# Patient Record
Sex: Male | Born: 1950 | Race: White | Hispanic: No | Marital: Married | Smoking: Never smoker
Health system: Southern US, Community
[De-identification: ages and names within clinical notes are randomized; demographics above are authoritative.]

## PROBLEM LIST (undated history)

## (undated) DIAGNOSIS — E039 Hypothyroidism, unspecified: Secondary | ICD-10-CM

## (undated) DIAGNOSIS — E0590 Thyrotoxicosis, unspecified without thyrotoxic crisis or storm: Secondary | ICD-10-CM

## (undated) DIAGNOSIS — E785 Hyperlipidemia, unspecified: Secondary | ICD-10-CM

## (undated) DIAGNOSIS — K219 Gastro-esophageal reflux disease without esophagitis: Secondary | ICD-10-CM

## (undated) DIAGNOSIS — M1611 Unilateral primary osteoarthritis, right hip: Secondary | ICD-10-CM

## (undated) HISTORY — DX: Hyperlipidemia, unspecified: E78.5

## (undated) HISTORY — PX: COLONOSCOPY: SHX174

## (undated) HISTORY — DX: Gastro-esophageal reflux disease without esophagitis: K21.9

## (undated) HISTORY — PX: ESOPHAGOGASTRODUODENOSCOPY: SHX1529

## (undated) HISTORY — DX: Hypothyroidism, unspecified: E03.9

---

## 2000-08-03 ENCOUNTER — Ambulatory Visit (HOSPITAL_COMMUNITY): Admission: RE | Admit: 2000-08-03 | Discharge: 2000-08-03 | Payer: Self-pay | Admitting: *Deleted

## 2001-11-17 ENCOUNTER — Emergency Department (HOSPITAL_COMMUNITY): Admission: EM | Admit: 2001-11-17 | Discharge: 2001-11-18 | Payer: Self-pay | Admitting: Emergency Medicine

## 2010-09-01 ENCOUNTER — Ambulatory Visit: Payer: 59 | Attending: Family Medicine | Admitting: Physical Therapy

## 2010-09-01 DIAGNOSIS — IMO0001 Reserved for inherently not codable concepts without codable children: Secondary | ICD-10-CM | POA: Insufficient documentation

## 2010-09-01 DIAGNOSIS — M545 Low back pain, unspecified: Secondary | ICD-10-CM | POA: Insufficient documentation

## 2010-09-01 DIAGNOSIS — M2569 Stiffness of other specified joint, not elsewhere classified: Secondary | ICD-10-CM | POA: Insufficient documentation

## 2010-09-02 ENCOUNTER — Ambulatory Visit: Payer: 59 | Admitting: Physical Therapy

## 2010-09-05 ENCOUNTER — Ambulatory Visit: Payer: 59 | Admitting: Physical Therapy

## 2010-09-07 ENCOUNTER — Encounter: Payer: 59 | Admitting: Physical Therapy

## 2010-09-12 ENCOUNTER — Encounter: Payer: 59 | Admitting: Physical Therapy

## 2010-09-14 ENCOUNTER — Encounter: Payer: 59 | Admitting: Physical Therapy

## 2010-09-19 ENCOUNTER — Encounter: Payer: 59 | Admitting: Physical Therapy

## 2010-09-21 ENCOUNTER — Encounter: Payer: 59 | Admitting: Physical Therapy

## 2015-08-02 ENCOUNTER — Encounter: Payer: Self-pay | Admitting: Physician Assistant

## 2015-08-03 ENCOUNTER — Ambulatory Visit (INDEPENDENT_AMBULATORY_CARE_PROVIDER_SITE_OTHER): Payer: 59 | Admitting: Physician Assistant

## 2015-08-03 ENCOUNTER — Encounter: Payer: Self-pay | Admitting: Physician Assistant

## 2015-08-03 VITALS — BP 122/74 | HR 68 | Ht 70.0 in | Wt 190.4 lb

## 2015-08-03 DIAGNOSIS — R0789 Other chest pain: Secondary | ICD-10-CM | POA: Diagnosis not present

## 2015-08-03 DIAGNOSIS — E785 Hyperlipidemia, unspecified: Secondary | ICD-10-CM

## 2015-08-03 DIAGNOSIS — K219 Gastro-esophageal reflux disease without esophagitis: Secondary | ICD-10-CM | POA: Diagnosis not present

## 2015-08-03 NOTE — Patient Instructions (Signed)
Medication Instructions:  No changes.  See your medication list.   Labwork: None   Testing/Procedures: Schedule an exercise treadmill test.  Follow-Up: Dr. Liam Rogers as needed.   Any Other Special Instructions Will Be Listed Below (If Applicable). Your physician has requested that you have an exercise tolerance test. For further information please visit [email or web address]. Please also follow instruction sheet, as given.  If you need a refill on your cardiac medications before your next appointment, please call your pharmacy.

## 2015-08-03 NOTE — Progress Notes (Signed)
Cardiology Office Note:    Date:  08/03/2015   ID:  Terry Chapman, DOB July 25, 1950, MRN [id number]  PCP:   Melinda Crutch, MD  Cardiologist:  New - Dr. Liam Rogers    Referring MD:  Dr. Lawernce Pitts   Chief Complaint  Patient presents with  . Chest Pain    New Consult    History of Present Illness:     Terry Chapman is a 65 y.o. male with a hx of GERD, HL.  He is referred by Dr. Rex Kras for the evaluation of chest pain.  He has noticed chest pain off and on for years. But, 2 weeks ago, he had a severe episode.  This only went on for seconds.  He is friends with Dr. Rex Kras and he recommended evaluation with Cardiology.  His chest discomfort is located in the epigastrium and lower sternum.  He denies associated dyspnea or diaphoresis.  He did feel somewhat nauseated.  He denies any hx of syncope, orthopnea, PND, edema.  He exercises frequently.  He plays tennis 3 days a week and walks on a treadmill.  He denies any exertional chest pain or dyspnea.      Past Medical History  Diagnosis Date  . GERD (gastroesophageal reflux disease)   . Hyperlipidemia     Past Surgical History  Procedure Laterality Date  . Colonoscopy    . Esophagogastroduodenoscopy      Current Medications: Outpatient Prescriptions Prior to Visit  Medication Sig Dispense Refill  . esomeprazole (NEXIUM) 20 MG capsule Take 20 mg by mouth daily at 12 noon.    . rosuvastatin (CRESTOR) 5 MG tablet Take 5 mg by mouth daily.    . sucralfate (CARAFATE) 1 GM/10ML suspension Take 1 g by mouth 2 (two) times daily. Reported on 08/03/2015     No facility-administered medications prior to visit.     Allergies:   Review of patient's allergies indicates no known allergies.   Social History   Social History  . Marital Status: Married    Spouse Name: N/A  . Number of Children: 2  . Years of Education: N/A   Occupational History  . civil Chief Financial Officer    Social History Main Topics  . Smoking status: Never Smoker   . Smokeless  tobacco: None  . Alcohol Use: No  . Drug Use: No  . Sexual Activity: Not Asked   Other Topics Concern  . None   Social History Narrative   Civil engineer, contracting - [employer]   [employer]   Married   stepchildren     Family History:  The patient's family history includes Alcohol abuse in his father; Colon cancer in his father; Hyperlipidemia in his sister and sister; Hypertension in his father. There is no history of Heart attack.   ROS:   Please see the history of present illness.    Review of Systems  Constitution: Negative for weight loss.  Cardiovascular: Positive for irregular heartbeat.  Gastrointestinal: Negative for dysphagia, hematochezia and vomiting.  Psychiatric/Behavioral: The patient is nervous/anxious.   All other systems reviewed and are negative.   Physical Exam:    VS:  BP 122/74 mmHg  Pulse 68  Ht 5\' 10"  (1.778 m)  Wt 190 lb 6.4 oz (86.365 kg)  BMI 27.32 kg/m2   GEN: Well nourished, well developed, in no acute distress HEENT: normal Neck: no JVD, no masses Cardiac: Normal S1/S2, RRR; no murmurs, rubs, or gallops, no edema;  no carotid bruits,   Respiratory:  clear  to auscultation bilaterally; no wheezing, rhonchi or rales GI: soft, nontender, nondistended, + BS MS: no deformity or atrophy Skin: warm and dry, no rash Neuro:  Bilateral strength equal, no focal deficits  Psych: Alert and oriented x 3, normal affect  Wt Readings from Last 3 Encounters:  08/03/15 190 lb 6.4 oz (86.365 kg)      Studies/Labs Reviewed:     EKG:  EKG is  ordered today.  The ekg ordered today demonstrates NSR, HR 62, septal Q waves, QTc 395 ms  Recent Labs: No results found for requested labs within last 365 days.   Recent Lipid Panel No results found for: CHOL, TRIG, HDL, CHOLHDL, VLDL, LDLCALC, LDLDIRECT  Additional studies/ records that were reviewed today include:   None    ASSESSMENT:     1. Other chest pain   2. Gastroesophageal reflux disease, esophagitis  presence not specified   3. Hyperlipidemia     PLAN:     In order of problems listed above:  1. Chest pain - Atypical chest symptoms.  He has few risk factors to include age, gender and HL.  ECG is normal. We will arrange a plain Exercise Stress Test. If low risk, FU with PCP to evaluate other causes of chest pain.  2. GERD - Continue PPI. FU with PCP.   3. HL - He remains on Rosuvastatin.  This is managed by primary care. I will request most recent labs.      Medication Adjustments/Labs and Tests Ordered: Current medicines are reviewed at length with the patient today.  Concerns regarding medicines are outlined above.  Medication changes, Labs and Tests ordered today are outlined in the Patient Instructions noted below. Patient Instructions  Medication Instructions:  No changes.  See your medication list.   Labwork: None   Testing/Procedures: Schedule an exercise treadmill test.  Follow-Up: Dr. Liam Rogers as needed.   Any Other Special Instructions Will Be Listed Below (If Applicable). Your physician has requested that you have an exercise tolerance test. For further information please visit [email or web address]. Please also follow instruction sheet, as given.  If you need a refill on your cardiac medications before your next appointment, please call your pharmacy.     Signed, Richardson Dopp, PA-C  08/03/2015 3:23 PM    [facility] [place], [place], [place]  [address] Phone: [phone]; Fax: [phone]   Attending Note:   The patient was seen and examined.  Agree with assessment and plan as noted above.  Changes made to the above note as needed.  I have reviewed the chart with Richardson Dopp and have talked with and examined the patient . His CP is very atypical.   Will do a GXT. He can follow up with me as needed ( assumig the GXT is normal )    Thayer Headings, Brooke Bonito., MD, [facility] 08/03/2015, 6:49 PM [address],  [place] Pager [phone]

## 2015-08-11 ENCOUNTER — Ambulatory Visit (INDEPENDENT_AMBULATORY_CARE_PROVIDER_SITE_OTHER): Payer: 59

## 2015-08-11 DIAGNOSIS — R0789 Other chest pain: Secondary | ICD-10-CM | POA: Diagnosis not present

## 2015-08-11 LAB — EXERCISE TOLERANCE TEST
CHL CUP MPHR: 157 {beats}/min
CHL CUP RESTING HR STRESS: 59 {beats}/min
CHL CUP STRESS STAGE 1 DBP: 98 mmHg
CHL CUP STRESS STAGE 1 GRADE: 0 %
CHL CUP STRESS STAGE 1 HR: 65 {beats}/min
CHL CUP STRESS STAGE 3 SPEED: 1 mph
CHL CUP STRESS STAGE 4 DBP: 93 mmHg
CHL CUP STRESS STAGE 4 GRADE: 10 %
CHL CUP STRESS STAGE 4 HR: 83 {beats}/min
CHL CUP STRESS STAGE 4 SBP: 155 mmHg
CHL CUP STRESS STAGE 4 SPEED: 1.7 mph
CHL CUP STRESS STAGE 5 DBP: 93 mmHg
CHL CUP STRESS STAGE 5 SBP: 167 mmHg
CHL CUP STRESS STAGE 6 DBP: 92 mmHg
CHL CUP STRESS STAGE 6 GRADE: 14 %
CHL CUP STRESS STAGE 7 HR: 148 {beats}/min
CHL CUP STRESS STAGE 8 SBP: 184 mmHg
CHL CUP STRESS STAGE 8 SPEED: 1.5 mph
CHL CUP STRESS STAGE 9 HR: 76 {beats}/min
CHL CUP STRESS STAGE 9 SBP: 150 mmHg
CSEPED: 11 min
Estimated workload: 13.3 METS
Exercise duration (sec): 0 s
Peak HR: 148 {beats}/min
Percent HR: 94 %
Percent of predicted max HR: 94 %
RPE: 17
Stage 1 SBP: 146 mmHg
Stage 1 Speed: 0 mph
Stage 2 Grade: 0 %
Stage 2 HR: 74 {beats}/min
Stage 2 Speed: 1 mph
Stage 3 Grade: 0.2 %
Stage 3 HR: 75 {beats}/min
Stage 5 Grade: 12 %
Stage 5 HR: 99 {beats}/min
Stage 5 Speed: 2.5 mph
Stage 6 HR: 120 {beats}/min
Stage 6 SBP: 178 mmHg
Stage 6 Speed: 3.4 mph
Stage 7 Grade: 16 %
Stage 7 Speed: 4.2 mph
Stage 8 DBP: 94 mmHg
Stage 8 Grade: 0 %
Stage 8 HR: 123 {beats}/min
Stage 9 DBP: 94 mmHg
Stage 9 Grade: 0 %
Stage 9 Speed: 0 mph

## 2016-07-11 DIAGNOSIS — K449 Diaphragmatic hernia without obstruction or gangrene: Secondary | ICD-10-CM | POA: Diagnosis not present

## 2016-07-11 DIAGNOSIS — K219 Gastro-esophageal reflux disease without esophagitis: Secondary | ICD-10-CM | POA: Diagnosis not present

## 2016-07-14 DIAGNOSIS — H01024 Squamous blepharitis left upper eyelid: Secondary | ICD-10-CM | POA: Diagnosis not present

## 2016-07-14 DIAGNOSIS — H01021 Squamous blepharitis right upper eyelid: Secondary | ICD-10-CM | POA: Diagnosis not present

## 2016-07-14 DIAGNOSIS — H40013 Open angle with borderline findings, low risk, bilateral: Secondary | ICD-10-CM | POA: Diagnosis not present

## 2016-08-16 DIAGNOSIS — E559 Vitamin D deficiency, unspecified: Secondary | ICD-10-CM | POA: Diagnosis not present

## 2016-08-16 DIAGNOSIS — E785 Hyperlipidemia, unspecified: Secondary | ICD-10-CM | POA: Diagnosis not present

## 2016-08-16 DIAGNOSIS — Z0000 Encounter for general adult medical examination without abnormal findings: Secondary | ICD-10-CM | POA: Diagnosis not present

## 2016-08-21 DIAGNOSIS — Z0000 Encounter for general adult medical examination without abnormal findings: Secondary | ICD-10-CM | POA: Diagnosis not present

## 2016-11-10 DIAGNOSIS — H40013 Open angle with borderline findings, low risk, bilateral: Secondary | ICD-10-CM | POA: Diagnosis not present

## 2017-07-02 DIAGNOSIS — M25559 Pain in unspecified hip: Secondary | ICD-10-CM | POA: Diagnosis not present

## 2017-07-06 DIAGNOSIS — M25551 Pain in right hip: Secondary | ICD-10-CM | POA: Diagnosis not present

## 2017-07-13 DIAGNOSIS — H01024 Squamous blepharitis left upper eyelid: Secondary | ICD-10-CM | POA: Diagnosis not present

## 2017-07-13 DIAGNOSIS — H01021 Squamous blepharitis right upper eyelid: Secondary | ICD-10-CM | POA: Diagnosis not present

## 2017-07-13 DIAGNOSIS — H43811 Vitreous degeneration, right eye: Secondary | ICD-10-CM | POA: Diagnosis not present

## 2017-07-17 DIAGNOSIS — M25551 Pain in right hip: Secondary | ICD-10-CM | POA: Diagnosis not present

## 2017-07-17 DIAGNOSIS — M25651 Stiffness of right hip, not elsewhere classified: Secondary | ICD-10-CM | POA: Diagnosis not present

## 2017-07-17 DIAGNOSIS — R531 Weakness: Secondary | ICD-10-CM | POA: Diagnosis not present

## 2017-08-06 DIAGNOSIS — M25551 Pain in right hip: Secondary | ICD-10-CM | POA: Diagnosis not present

## 2017-08-09 DIAGNOSIS — M25551 Pain in right hip: Secondary | ICD-10-CM | POA: Diagnosis not present

## 2017-08-09 DIAGNOSIS — M25651 Stiffness of right hip, not elsewhere classified: Secondary | ICD-10-CM | POA: Diagnosis not present

## 2017-08-09 DIAGNOSIS — M1611 Unilateral primary osteoarthritis, right hip: Secondary | ICD-10-CM | POA: Diagnosis not present

## 2017-09-14 DIAGNOSIS — M25551 Pain in right hip: Secondary | ICD-10-CM | POA: Diagnosis not present

## 2017-09-14 DIAGNOSIS — M1611 Unilateral primary osteoarthritis, right hip: Secondary | ICD-10-CM | POA: Diagnosis not present

## 2017-09-18 DIAGNOSIS — E559 Vitamin D deficiency, unspecified: Secondary | ICD-10-CM | POA: Diagnosis not present

## 2017-09-18 DIAGNOSIS — R946 Abnormal results of thyroid function studies: Secondary | ICD-10-CM | POA: Diagnosis not present

## 2017-09-18 DIAGNOSIS — E785 Hyperlipidemia, unspecified: Secondary | ICD-10-CM | POA: Diagnosis not present

## 2017-09-18 DIAGNOSIS — Z125 Encounter for screening for malignant neoplasm of prostate: Secondary | ICD-10-CM | POA: Diagnosis not present

## 2017-09-19 DIAGNOSIS — M25511 Pain in right shoulder: Secondary | ICD-10-CM | POA: Diagnosis not present

## 2017-09-19 DIAGNOSIS — Z0000 Encounter for general adult medical examination without abnormal findings: Secondary | ICD-10-CM | POA: Diagnosis not present

## 2017-09-19 DIAGNOSIS — Z23 Encounter for immunization: Secondary | ICD-10-CM | POA: Diagnosis not present

## 2017-09-21 DIAGNOSIS — M1611 Unilateral primary osteoarthritis, right hip: Secondary | ICD-10-CM | POA: Diagnosis not present

## 2017-10-05 DIAGNOSIS — M25551 Pain in right hip: Secondary | ICD-10-CM | POA: Diagnosis not present

## 2017-10-05 DIAGNOSIS — M1611 Unilateral primary osteoarthritis, right hip: Secondary | ICD-10-CM | POA: Diagnosis not present

## 2017-10-29 DIAGNOSIS — M1611 Unilateral primary osteoarthritis, right hip: Secondary | ICD-10-CM | POA: Diagnosis not present

## 2017-12-07 ENCOUNTER — Other Ambulatory Visit: Payer: Self-pay | Admitting: Orthopedic Surgery

## 2017-12-13 ENCOUNTER — Encounter (HOSPITAL_COMMUNITY): Payer: Self-pay

## 2017-12-13 NOTE — Pre-Procedure Instructions (Signed)
Terry Chapman  12/13/2017      [id number] [place], [place] [address] [place] [address] Phone: [phone] Fax: [phone]    Your procedure is scheduled on July 2  Report to [facility] Admitting at 0800 A.M.  Call this number if you have problems the morning of surgery:  [phone]   Remember:  NOTHING TO EAT OR DRINK AFTER MIDNIGHT    Take these medicines the morning of surgery with A SIP OF WATER  esomeprazole (NEXIUM)  levothyroxine (SYNTHROID, LEVOTHROID)   7 days prior to surgery STOP taking any Aspirin(unless otherwise instructed by your surgeon), Aleve, Naproxen, Ibuprofen, Motrin, Advil, Goody's, BC's, all herbal medications, fish oil, and all vitamins     Do not wear jewelry  Do not wear lotions, powders, or COLOGNE, or deodorant.  Men may shave face and neck.  Do not bring valuables to the hospital.  [facility] is not responsible for any belongings or valuables.  Contacts, dentures or bridgework may not be worn into surgery.  Leave your suitcase in the car.  After surgery it may be brought to your room.  For patients admitted to the hospital, discharge time will be determined by your treatment team.  Patients discharged the day of surgery will not be allowed to drive home.    Special instructions:   [facility]- Preparing For Surgery  Before surgery, you can play an important role. Because skin is not sterile, your skin needs to be as free of germs as possible. You can reduce the number of germs on your skin by washing with CHG (chlorahexidine gluconate) Soap before surgery.  CHG is an antiseptic cleaner which kills germs and bonds with the skin to continue killing germs even after washing.    Oral Hygiene is also important to reduce your risk of infection.  Remember - BRUSH YOUR TEETH THE MORNING OF SURGERY WITH YOUR REGULAR TOOTHPASTE  Please do not use if you have an allergy to CHG or antibacterial  soaps. If your skin becomes reddened/irritated stop using the CHG.  Do not shave (including legs and underarms) for at least 48 hours prior to first CHG shower. It is OK to shave your face.  Please follow these instructions carefully.   1. Shower the NIGHT BEFORE SURGERY and the MORNING OF SURGERY with CHG.   2. If you chose to wash your hair, wash your hair first as usual with your normal shampoo.  3. After you shampoo, rinse your hair and body thoroughly to remove the shampoo.  4. Use CHG as you would any other liquid soap. You can apply CHG directly to the skin and wash gently with a scrungie or a clean washcloth.   5. Apply the CHG Soap to your body ONLY FROM THE NECK DOWN.  Do not use on open wounds or open sores. Avoid contact with your eyes, ears, mouth and genitals (private parts). Wash Face and genitals (private parts)  with your normal soap.  6. Wash thoroughly, paying special attention to the area where your surgery will be performed.  7. Thoroughly rinse your body with warm water from the neck down.  8. DO NOT shower/wash with your normal soap after using and rinsing off the CHG Soap.  9. Pat yourself dry with a CLEAN TOWEL.  10. Wear CLEAN PAJAMAS to bed the night before surgery, wear comfortable clothes the morning of surgery  11. Place CLEAN SHEETS on your  bed the night of your first shower and DO NOT SLEEP WITH PETS.    Day of Surgery:  Do not apply any deodorants/lotions.  Please wear clean clothes to the hospital/surgery center.   Remember to brush your teeth WITH YOUR REGULAR TOOTHPASTE.    Please read over the following fact sheets that you were given.

## 2017-12-14 ENCOUNTER — Other Ambulatory Visit: Payer: Self-pay

## 2017-12-14 ENCOUNTER — Encounter (HOSPITAL_COMMUNITY)
Admission: RE | Admit: 2017-12-14 | Discharge: 2017-12-14 | Disposition: A | Discharge status: Home / Self Care | Payer: 59 | Source: Ambulatory Visit | Attending: Orthopedic Surgery | Admitting: Orthopedic Surgery

## 2017-12-14 ENCOUNTER — Encounter (HOSPITAL_COMMUNITY): Payer: Self-pay

## 2017-12-14 DIAGNOSIS — Z01812 Encounter for preprocedural laboratory examination: Secondary | ICD-10-CM | POA: Diagnosis not present

## 2017-12-14 HISTORY — DX: Thyrotoxicosis, unspecified without thyrotoxic crisis or storm: E05.90

## 2017-12-14 LAB — SURGICAL PCR SCREEN
MRSA, PCR: NEGATIVE
STAPHYLOCOCCUS AUREUS: NEGATIVE

## 2017-12-14 LAB — BASIC METABOLIC PANEL
ANION GAP: 6 (ref 5–15)
BUN: 14 mg/dL (ref 6–20)
CHLORIDE: 104 mmol/L (ref 101–111)
CO2: 29 mmol/L (ref 22–32)
Calcium: 9.2 mg/dL (ref 8.9–10.3)
Creatinine, Ser: 0.97 mg/dL (ref 0.61–1.24)
GFR calc non Af Amer: >60 mL/min (ref >60)
Glucose, Bld: 80 mg/dL (ref 65–99)
POTASSIUM: 4.1 mmol/L (ref 3.5–5.1)
Sodium: 139 mmol/L (ref 135–145)

## 2017-12-14 LAB — CBC
HEMATOCRIT: 47.1 % (ref 39.0–52.0)
HEMOGLOBIN: 15.3 g/dL (ref 13.0–17.0)
MCH: 29.3 pg (ref 26.0–34.0)
MCHC: 32.5 g/dL (ref 30.0–36.0)
MCV: 90.2 fL (ref 78.0–100.0)
Platelets: 224 10*3/uL (ref 150–400)
RBC: 5.22 MIL/uL (ref 4.22–5.81)
RDW: 12.6 % (ref 11.5–15.5)
WBC: 5.9 10*3/uL (ref 4.0–10.5)

## 2017-12-14 NOTE — Progress Notes (Signed)
PCP - Lawerance Cruel Cardiologist - denies  Chest x-ray - not needed EKG - not needed Stress Test - 2017 ECHO - denies Cardiac Cath - denies   Anesthesia review: NO  Patient denies shortness of breath, fever, cough and chest pain at PAT appointment   Patient verbalized understanding of instructions that were given to them at the PAT appointment. Patient was also instructed that they will need to review over the PAT instructions again at home before surgery.

## 2017-12-25 ENCOUNTER — Inpatient Hospital Stay (HOSPITAL_COMMUNITY)
Admission: RE | Admit: 2017-12-25 | Discharge: 2017-12-26 | DRG: 470 | Disposition: A | Discharge status: Home Health | Payer: 59 | Source: Ambulatory Visit | Attending: Orthopedic Surgery | Admitting: Orthopedic Surgery

## 2017-12-25 ENCOUNTER — Inpatient Hospital Stay (HOSPITAL_COMMUNITY): Payer: 59

## 2017-12-25 ENCOUNTER — Other Ambulatory Visit: Payer: Self-pay

## 2017-12-25 ENCOUNTER — Inpatient Hospital Stay (HOSPITAL_COMMUNITY): Payer: 59 | Admitting: Certified Registered Nurse Anesthetist

## 2017-12-25 ENCOUNTER — Encounter (HOSPITAL_COMMUNITY): Payer: Self-pay | Admitting: Certified Registered Nurse Anesthetist

## 2017-12-25 ENCOUNTER — Encounter (HOSPITAL_COMMUNITY)
Admission: RE | Disposition: A | Discharge status: Home Health | Payer: Self-pay | Source: Ambulatory Visit | Attending: Orthopedic Surgery

## 2017-12-25 DIAGNOSIS — E0590 Thyrotoxicosis, unspecified without thyrotoxic crisis or storm: Secondary | ICD-10-CM | POA: Diagnosis present

## 2017-12-25 DIAGNOSIS — K219 Gastro-esophageal reflux disease without esophagitis: Secondary | ICD-10-CM | POA: Diagnosis not present

## 2017-12-25 DIAGNOSIS — M1611 Unilateral primary osteoarthritis, right hip: Secondary | ICD-10-CM | POA: Diagnosis not present

## 2017-12-25 DIAGNOSIS — Z79899 Other long term (current) drug therapy: Secondary | ICD-10-CM

## 2017-12-25 DIAGNOSIS — Z96649 Presence of unspecified artificial hip joint: Secondary | ICD-10-CM

## 2017-12-25 DIAGNOSIS — Z471 Aftercare following joint replacement surgery: Secondary | ICD-10-CM | POA: Diagnosis not present

## 2017-12-25 DIAGNOSIS — Z96641 Presence of right artificial hip joint: Secondary | ICD-10-CM | POA: Diagnosis not present

## 2017-12-25 DIAGNOSIS — M1610 Unilateral primary osteoarthritis, unspecified hip: Secondary | ICD-10-CM | POA: Diagnosis present

## 2017-12-25 DIAGNOSIS — E785 Hyperlipidemia, unspecified: Secondary | ICD-10-CM | POA: Diagnosis not present

## 2017-12-25 HISTORY — PX: TOTAL HIP ARTHROPLASTY: SHX124

## 2017-12-25 HISTORY — DX: Unilateral primary osteoarthritis, right hip: M16.11

## 2017-12-25 SURGERY — ARTHROPLASTY, HIP, TOTAL,POSTERIOR APPROACH
Anesthesia: Spinal | Site: Hip | Laterality: Right

## 2017-12-25 MED ORDER — ACETAMINOPHEN 500 MG PO TABS
500.0000 mg | ORAL_TABLET | Freq: Four times a day (QID) | ORAL | Status: AC
  Administered 2017-12-25 – 2017-12-26 (×4): 500 mg via ORAL
  Filled 2017-12-25 (×4): qty 1

## 2017-12-25 MED ORDER — ASPIRIN EC 325 MG PO TBEC: 325.0000 mg | DELAYED_RELEASE_TABLET | Freq: Two times a day (BID) | ORAL | 0 refills | Status: DC

## 2017-12-25 MED ORDER — CEFAZOLIN SODIUM-DEXTROSE 2-4 GM/100ML-% IV SOLN
2.0000 g | Freq: Four times a day (QID) | INTRAVENOUS | Status: AC
  Administered 2017-12-25 (×2): 2 g via INTRAVENOUS
  Filled 2017-12-25: qty 100

## 2017-12-25 MED ORDER — SODIUM CHLORIDE 0.9 % IV BOLUS
500.0000 mL | Freq: Once | INTRAVENOUS | Status: AC
  Administered 2017-12-25: 500 mL via INTRAVENOUS

## 2017-12-25 MED ORDER — CHLORHEXIDINE GLUCONATE 4 % EX LIQD: 60.0000 mL | Freq: Once | CUTANEOUS | Status: DC

## 2017-12-25 MED ORDER — TRANEXAMIC ACID 1000 MG/10ML IV SOLN
1000.0000 mg | Freq: Once | INTRAVENOUS | Status: AC
  Administered 2017-12-25: 1000 mg via INTRAVENOUS
  Filled 2017-12-25 (×2): qty 10

## 2017-12-25 MED ORDER — VITAMIN D3 25 MCG (1000 UNIT) PO TABS
1000.0000 [IU] | ORAL_TABLET | Freq: Every day | ORAL | Status: DC
  Administered 2017-12-26: 1000 [IU] via ORAL
  Filled 2017-12-25 (×2): qty 1

## 2017-12-25 MED ORDER — BACLOFEN 10 MG PO TABS: 10.0000 mg | ORAL_TABLET | Freq: Three times a day (TID) | ORAL | 0 refills | Status: DC

## 2017-12-25 MED ORDER — OXYCODONE HCL 5 MG/5ML PO SOLN: 5.0000 mg | Freq: Once | ORAL | Status: AC | PRN

## 2017-12-25 MED ORDER — FENTANYL CITRATE (PF) 100 MCG/2ML IJ SOLN
INTRAMUSCULAR | Status: AC
  Filled 2017-12-25: qty 2

## 2017-12-25 MED ORDER — ACETAMINOPHEN 325 MG PO TABS: 325.0000 mg | ORAL_TABLET | Freq: Four times a day (QID) | ORAL | Status: DC | PRN

## 2017-12-25 MED ORDER — POTASSIUM CHLORIDE IN NACL 20-0.45 MEQ/L-% IV SOLN
INTRAVENOUS | Status: DC
  Administered 2017-12-25: 19:00:00 via INTRAVENOUS
  Filled 2017-12-25 (×2): qty 1000

## 2017-12-25 MED ORDER — ASPIRIN EC 325 MG PO TBEC
325.0000 mg | DELAYED_RELEASE_TABLET | Freq: Two times a day (BID) | ORAL | Status: DC
  Administered 2017-12-25 – 2017-12-26 (×2): 325 mg via ORAL
  Filled 2017-12-25 (×2): qty 1

## 2017-12-25 MED ORDER — FENTANYL CITRATE (PF) 100 MCG/2ML IJ SOLN
25.0000 ug | INTRAMUSCULAR | Status: DC | PRN
  Administered 2017-12-25: 50 ug via INTRAVENOUS
  Administered 2017-12-25: 25 ug via INTRAVENOUS
  Administered 2017-12-25: 50 ug via INTRAVENOUS
  Administered 2017-12-25: 25 ug via INTRAVENOUS

## 2017-12-25 MED ORDER — HYDROCODONE-ACETAMINOPHEN 5-325 MG PO TABS: 1.0000 | ORAL_TABLET | ORAL | Status: DC | PRN

## 2017-12-25 MED ORDER — HYDROCODONE-ACETAMINOPHEN 10-325 MG PO TABS: 1.0000 | ORAL_TABLET | Freq: Four times a day (QID) | ORAL | 0 refills | Status: DC | PRN

## 2017-12-25 MED ORDER — MENTHOL 3 MG MT LOZG: 1.0000 | LOZENGE | OROMUCOSAL | Status: DC | PRN

## 2017-12-25 MED ORDER — MIDAZOLAM HCL 2 MG/2ML IJ SOLN
INTRAMUSCULAR | Status: AC
  Filled 2017-12-25: qty 2

## 2017-12-25 MED ORDER — ROSUVASTATIN CALCIUM 5 MG PO TABS
5.0000 mg | ORAL_TABLET | Freq: Every evening | ORAL | Status: DC
  Administered 2017-12-25: 5 mg via ORAL
  Filled 2017-12-25: qty 1

## 2017-12-25 MED ORDER — BISACODYL 10 MG RE SUPP: 10.0000 mg | Freq: Every day | RECTAL | Status: DC | PRN

## 2017-12-25 MED ORDER — MEPERIDINE HCL 50 MG/ML IJ SOLN: 6.2500 mg | INTRAMUSCULAR | Status: DC | PRN

## 2017-12-25 MED ORDER — METOCLOPRAMIDE HCL 5 MG PO TABS: 5.0000 mg | ORAL_TABLET | Freq: Three times a day (TID) | ORAL | Status: DC | PRN

## 2017-12-25 MED ORDER — ALUM & MAG HYDROXIDE-SIMETH 200-200-20 MG/5ML PO SUSP: 30.0000 mL | ORAL | Status: DC | PRN

## 2017-12-25 MED ORDER — FENTANYL CITRATE (PF) 100 MCG/2ML IJ SOLN
INTRAMUSCULAR | Status: DC | PRN
  Administered 2017-12-25: 50 ug via INTRAVENOUS

## 2017-12-25 MED ORDER — ONDANSETRON HCL 4 MG/2ML IJ SOLN: 4.0000 mg | Freq: Once | INTRAMUSCULAR | Status: DC | PRN

## 2017-12-25 MED ORDER — ACETAMINOPHEN 160 MG/5ML PO SOLN: 325.0000 mg | ORAL | Status: DC | PRN

## 2017-12-25 MED ORDER — PHENYLEPHRINE 40 MCG/ML (10ML) SYRINGE FOR IV PUSH (FOR BLOOD PRESSURE SUPPORT)
PREFILLED_SYRINGE | INTRAVENOUS | Status: DC | PRN
  Administered 2017-12-25: 80 ug via INTRAVENOUS

## 2017-12-25 MED ORDER — BUPIVACAINE HCL (PF) 0.25 % IJ SOLN
INTRAMUSCULAR | Status: AC
  Filled 2017-12-25: qty 30

## 2017-12-25 MED ORDER — KETOROLAC TROMETHAMINE 15 MG/ML IJ SOLN
INTRAMUSCULAR | Status: AC
  Filled 2017-12-25: qty 1

## 2017-12-25 MED ORDER — LEVOTHYROXINE SODIUM 25 MCG PO TABS
25.0000 ug | ORAL_TABLET | Freq: Every day | ORAL | Status: DC
  Administered 2017-12-26: 25 ug via ORAL
  Filled 2017-12-25: qty 1

## 2017-12-25 MED ORDER — PANTOPRAZOLE SODIUM 40 MG PO TBEC
40.0000 mg | DELAYED_RELEASE_TABLET | Freq: Every day | ORAL | Status: DC
  Administered 2017-12-26: 40 mg via ORAL
  Filled 2017-12-25: qty 1

## 2017-12-25 MED ORDER — OXYCODONE HCL 5 MG PO TABS
5.0000 mg | ORAL_TABLET | Freq: Once | ORAL | Status: AC | PRN
  Administered 2017-12-25: 5 mg via ORAL

## 2017-12-25 MED ORDER — PHENYLEPHRINE HCL 10 MG/ML IJ SOLN
INTRAVENOUS | Status: DC | PRN
  Administered 2017-12-25: 40 ug/min via INTRAVENOUS

## 2017-12-25 MED ORDER — CEFAZOLIN SODIUM-DEXTROSE 2-4 GM/100ML-% IV SOLN
2.0000 g | INTRAVENOUS | Status: AC
  Administered 2017-12-25: 2 g via INTRAVENOUS
  Filled 2017-12-25: qty 100

## 2017-12-25 MED ORDER — FENTANYL CITRATE (PF) 250 MCG/5ML IJ SOLN
INTRAMUSCULAR | Status: AC
  Filled 2017-12-25: qty 5

## 2017-12-25 MED ORDER — EPHEDRINE SULFATE 50 MG/ML IJ SOLN
INTRAMUSCULAR | Status: DC | PRN
  Administered 2017-12-25: 5 mg via INTRAVENOUS
  Administered 2017-12-25: 10 mg via INTRAVENOUS
  Administered 2017-12-25: 5 mg via INTRAVENOUS

## 2017-12-25 MED ORDER — METHOCARBAMOL 1000 MG/10ML IJ SOLN
500.0000 mg | Freq: Four times a day (QID) | INTRAVENOUS | Status: DC | PRN
  Filled 2017-12-25: qty 5

## 2017-12-25 MED ORDER — DEXAMETHASONE SODIUM PHOSPHATE 10 MG/ML IJ SOLN
10.0000 mg | Freq: Once | INTRAMUSCULAR | Status: AC
  Administered 2017-12-26: 10 mg via INTRAVENOUS
  Filled 2017-12-25: qty 1

## 2017-12-25 MED ORDER — PHENOL 1.4 % MT LIQD: 1.0000 | OROMUCOSAL | Status: DC | PRN

## 2017-12-25 MED ORDER — ACETAMINOPHEN 325 MG PO TABS: 325.0000 mg | ORAL_TABLET | ORAL | Status: DC | PRN

## 2017-12-25 MED ORDER — BUPIVACAINE HCL (PF) 0.25 % IJ SOLN
INTRAMUSCULAR | Status: DC | PRN
  Administered 2017-12-25: 20 mL

## 2017-12-25 MED ORDER — ONDANSETRON HCL 4 MG PO TABS: 4.0000 mg | ORAL_TABLET | Freq: Four times a day (QID) | ORAL | Status: DC | PRN

## 2017-12-25 MED ORDER — ZOLPIDEM TARTRATE 5 MG PO TABS: 5.0000 mg | ORAL_TABLET | Freq: Every evening | ORAL | Status: DC | PRN

## 2017-12-25 MED ORDER — MAGNESIUM CITRATE PO SOLN: 1.0000 | Freq: Once | ORAL | Status: DC | PRN

## 2017-12-25 MED ORDER — METOCLOPRAMIDE HCL 5 MG/ML IJ SOLN: 5.0000 mg | Freq: Three times a day (TID) | INTRAMUSCULAR | Status: DC | PRN

## 2017-12-25 MED ORDER — SENNA-DOCUSATE SODIUM 8.6-50 MG PO TABS: 2.0000 | ORAL_TABLET | Freq: Every day | ORAL | 1 refills | Status: DC

## 2017-12-25 MED ORDER — ONDANSETRON HCL 4 MG/2ML IJ SOLN: 4.0000 mg | Freq: Four times a day (QID) | INTRAMUSCULAR | Status: DC | PRN

## 2017-12-25 MED ORDER — POLYETHYLENE GLYCOL 3350 17 G PO PACK: 17.0000 g | PACK | Freq: Every day | ORAL | Status: DC | PRN

## 2017-12-25 MED ORDER — HYDROCODONE-ACETAMINOPHEN 7.5-325 MG PO TABS: 1.0000 | ORAL_TABLET | ORAL | Status: DC | PRN

## 2017-12-25 MED ORDER — CEFAZOLIN SODIUM-DEXTROSE 2-4 GM/100ML-% IV SOLN
INTRAVENOUS | Status: AC
  Administered 2017-12-25: 2000 mg
  Filled 2017-12-25: qty 100

## 2017-12-25 MED ORDER — KETOROLAC TROMETHAMINE 15 MG/ML IJ SOLN
7.5000 mg | Freq: Four times a day (QID) | INTRAMUSCULAR | Status: AC
  Administered 2017-12-25 – 2017-12-26 (×4): 7.5 mg via INTRAVENOUS
  Filled 2017-12-25 (×3): qty 1

## 2017-12-25 MED ORDER — DIPHENHYDRAMINE HCL 12.5 MG/5ML PO ELIX
12.5000 mg | ORAL_SOLUTION | ORAL | Status: DC | PRN
  Filled 2017-12-25: qty 10

## 2017-12-25 MED ORDER — SODIUM CHLORIDE 0.9 % IR SOLN
Status: DC | PRN
  Administered 2017-12-25: 1000 mL

## 2017-12-25 MED ORDER — PROPOFOL 500 MG/50ML IV EMUL
INTRAVENOUS | Status: DC | PRN
  Administered 2017-12-25: 75 ug/kg/min via INTRAVENOUS

## 2017-12-25 MED ORDER — OXYCODONE HCL 5 MG PO TABS
ORAL_TABLET | ORAL | Status: AC
  Filled 2017-12-25: qty 1

## 2017-12-25 MED ORDER — METHOCARBAMOL 500 MG PO TABS
500.0000 mg | ORAL_TABLET | Freq: Four times a day (QID) | ORAL | Status: DC | PRN
  Administered 2017-12-25 (×2): 500 mg via ORAL
  Filled 2017-12-25: qty 1

## 2017-12-25 MED ORDER — METHOCARBAMOL 500 MG PO TABS
ORAL_TABLET | ORAL | Status: AC
  Filled 2017-12-25: qty 1

## 2017-12-25 MED ORDER — MORPHINE SULFATE (PF) 2 MG/ML IV SOLN: 0.5000 mg | INTRAVENOUS | Status: DC | PRN

## 2017-12-25 MED ORDER — LACTATED RINGERS IV SOLN
INTRAVENOUS | Status: DC
  Administered 2017-12-25 (×2): via INTRAVENOUS

## 2017-12-25 MED ORDER — ONDANSETRON HCL 4 MG PO TABS: 4.0000 mg | ORAL_TABLET | Freq: Three times a day (TID) | ORAL | 0 refills | Status: DC | PRN

## 2017-12-25 MED ORDER — DOCUSATE SODIUM 100 MG PO CAPS
100.0000 mg | ORAL_CAPSULE | Freq: Two times a day (BID) | ORAL | Status: DC
  Administered 2017-12-26: 100 mg via ORAL
  Filled 2017-12-25 (×2): qty 1

## 2017-12-25 MED ORDER — BUPIVACAINE IN DEXTROSE 0.75-8.25 % IT SOLN
INTRATHECAL | Status: DC | PRN
  Administered 2017-12-25: 2 mL via INTRATHECAL

## 2017-12-25 SURGICAL SUPPLY — 53 items
BIT DRILL 5/64X5 DISP (BIT) ×2 IMPLANT
BLADE SAW SGTL 73X25 THK (BLADE) ×2 IMPLANT
CAPT HIP TOTAL 2 ×1 IMPLANT
CLSR STERI-STRIP ANTIMIC 1/2X4 (GAUZE/BANDAGES/DRESSINGS) ×3 IMPLANT
COVER SURGICAL LIGHT HANDLE (MISCELLANEOUS) ×2 IMPLANT
DECANTER SPIKE VIAL GLASS SM (MISCELLANEOUS) ×1 IMPLANT
DRAPE INCISE IOBAN 66X45 STRL (DRAPES) IMPLANT
DRAPE ORTHO SPLIT 77X108 STRL (DRAPES) ×4
DRAPE SURG ORHT 6 SPLT 77X108 (DRAPES) ×2 IMPLANT
DRAPE U-SHAPE 47X51 STRL (DRAPES) ×2 IMPLANT
DRSG MEPILEX BORDER 4X12 (GAUZE/BANDAGES/DRESSINGS) ×1 IMPLANT
DRSG MEPILEX BORDER 4X8 (GAUZE/BANDAGES/DRESSINGS) ×1 IMPLANT
DURAPREP 26ML APPLICATOR (WOUND CARE) ×2 IMPLANT
ELECT BLADE 4.0 EZ CLEAN MEGAD (MISCELLANEOUS) ×2
ELECT CAUTERY BLADE 6.4 (BLADE) ×2 IMPLANT
ELECT REM PT RETURN 9FT ADLT (ELECTROSURGICAL) ×2
ELECTRODE BLDE 4.0 EZ CLN MEGD (MISCELLANEOUS) IMPLANT
ELECTRODE REM PT RTRN 9FT ADLT (ELECTROSURGICAL) ×1 IMPLANT
GLOVE BIOGEL PI ORTHO PRO SZ8 (GLOVE) ×2
GLOVE ORTHO TXT STRL SZ7.5 (GLOVE) ×2 IMPLANT
GLOVE PI ORTHO PRO STRL SZ8 (GLOVE) ×2 IMPLANT
GLOVE SURG ORTHO 8.0 STRL STRW (GLOVE) ×2 IMPLANT
GLOVE SURG SS PI 6.0 STRL IVOR (GLOVE) ×1 IMPLANT
GLOVE SURG SS PI 7.0 STRL IVOR (GLOVE) ×2 IMPLANT
GOWN STRL REUS W/ TWL XL LVL3 (GOWN DISPOSABLE) ×1 IMPLANT
GOWN STRL REUS W/TWL 2XL LVL3 (GOWN DISPOSABLE) ×2 IMPLANT
GOWN STRL REUS W/TWL XL LVL3 (GOWN DISPOSABLE) ×2
HOOD PEEL AWAY FACE SHEILD DIS (HOOD) ×2 IMPLANT
HOOD PEEL AWAY FLYTE STAYCOOL (MISCELLANEOUS) ×2 IMPLANT
KIT BASIN OR (CUSTOM PROCEDURE TRAY) ×2 IMPLANT
KIT TURNOVER KIT B (KITS) ×2 IMPLANT
MANIFOLD NEPTUNE II (INSTRUMENTS) ×2 IMPLANT
NDL SAFETY ECLIPSE 18X1.5 (NEEDLE) ×1 IMPLANT
NEEDLE HYPO 18GX1.5 SHARP (NEEDLE) ×2
NS IRRIG 1000ML POUR BTL (IV SOLUTION) ×2 IMPLANT
PACK TOTAL JOINT (CUSTOM PROCEDURE TRAY) ×2 IMPLANT
PAD ARMBOARD 7.5X6 YLW CONV (MISCELLANEOUS) ×4 IMPLANT
PRESSURIZER FEMORAL UNIV (MISCELLANEOUS) IMPLANT
RETRIEVER SUT HEWSON (MISCELLANEOUS) ×2 IMPLANT
SUCTION FRAZIER HANDLE 10FR (MISCELLANEOUS) ×1
SUCTION TUBE FRAZIER 10FR DISP (MISCELLANEOUS) ×1 IMPLANT
SUT FIBERWIRE #2 38 REV NDL BL (SUTURE) ×6
SUT VIC AB 0 CT1 27 (SUTURE) ×2
SUT VIC AB 0 CT1 27XBRD ANBCTR (SUTURE) ×1 IMPLANT
SUT VIC AB 2-0 CT1 27 (SUTURE) ×2
SUT VIC AB 2-0 CT1 TAPERPNT 27 (SUTURE) ×1 IMPLANT
SUT VIC AB 3-0 SH 8-18 (SUTURE) ×2 IMPLANT
SUTURE FIBERWR#2 38 REV NDL BL (SUTURE) ×3 IMPLANT
SYR BULB IRRIGATION 50ML (SYRINGE) ×1 IMPLANT
SYR CONTROL 10ML LL (SYRINGE) ×2 IMPLANT
TOWEL OR 17X26 10 PK STRL BLUE (TOWEL DISPOSABLE) ×2 IMPLANT
TRAY CATH 16FR W/PLASTIC CATH (SET/KITS/TRAYS/PACK) ×1 IMPLANT
TRAY FOLEY W/BAG SLVR 14FR (SET/KITS/TRAYS/PACK) IMPLANT

## 2017-12-25 NOTE — Anesthesia Preprocedure Evaluation (Addendum)
Anesthesia Evaluation  Patient identified by MRN, date of birth, ID band Patient awake    Reviewed: Allergy & Precautions, H&P , NPO status , Patient's Chart, lab work & pertinent test results, reviewed documented beta blocker date and time   Airway Mallampati: II  TM Distance: >3 FB Neck ROM: full    Dental no notable dental hx.    Pulmonary neg pulmonary ROS,    Pulmonary exam normal breath sounds clear to auscultation       Cardiovascular Exercise Tolerance: Good negative cardio ROS   Rhythm:regular Rate:Normal     Neuro/Psych negative neurological ROS  negative psych ROS   GI/Hepatic negative GI ROS, Neg liver ROS, GERD  Medicated,  Endo/Other  negative endocrine ROSHyperthyroidism   Renal/GU negative Renal ROS  negative genitourinary   Musculoskeletal  (+) Arthritis , Osteoarthritis,    Abdominal   Peds  Hematology negative hematology ROS (+)   Anesthesia Other Findings   Reproductive/Obstetrics negative OB ROS                            Anesthesia Physical Anesthesia Plan  ASA: II  Anesthesia Plan: Spinal   Post-op Pain Management:    Induction:   PONV Risk Score and Plan: 1  Airway Management Planned: Nasal Cannula, Natural Airway and Mask  Additional Equipment:   Intra-op Plan:   Post-operative Plan:   Informed Consent: I have reviewed the patients History and Physical, chart, labs and discussed the procedure including the risks, benefits and alternatives for the proposed anesthesia with the patient or authorized representative who has indicated his/her understanding and acceptance.   Dental Advisory Given  Plan Discussed with: CRNA, Anesthesiologist and Surgeon  Anesthesia Plan Comments: (  )        Anesthesia Quick Evaluation

## 2017-12-25 NOTE — Anesthesia Procedure Notes (Signed)
Procedure Name: MAC Date/Time: 12/25/2017 10:33 AM Performed by: Carney Living, CRNA Pre-anesthesia Checklist: Patient identified, Emergency Drugs available, Suction available, Patient being monitored and Timeout performed Patient Re-evaluated:Patient Re-evaluated prior to induction Oxygen Delivery Method: Nasal cannula

## 2017-12-25 NOTE — Discharge Instructions (Signed)

## 2017-12-25 NOTE — Progress Notes (Signed)
Patient had a 2 person assisted fall while ambulating to bathroom. Patient reported no pain. Post fall Dr. Mardelle Matte notified. Dr. Mardelle Matte ordered one time bolus of NS 500 mL.

## 2017-12-25 NOTE — Anesthesia Procedure Notes (Signed)
Spinal  Patient location during procedure: OR Start time: 12/25/2017 10:33 AM End time: 12/25/2017 10:38 AM Staffing Anesthesiologist: Janeece Riggers, MD Preanesthetic Checklist Completed: patient identified, site marked, surgical consent, pre-op evaluation, timeout performed, IV checked, risks and benefits discussed and monitors and equipment checked Spinal Block Patient position: sitting Prep: DuraPrep Patient monitoring: heart rate, cardiac monitor, continuous pulse ox and blood pressure Approach: midline Location: L3-4 Injection technique: single-shot Needle Needle type: Sprotte  Needle gauge: 24 G Needle length: 9 cm Assessment Sensory level: T4

## 2017-12-25 NOTE — Op Note (Signed)
12/25/2017  12:21 PM  PATIENT:  Terry Chapman   MRN: [id number]  PRE-OPERATIVE DIAGNOSIS: Right hip primary localized osteoarthritis  POST-OPERATIVE DIAGNOSIS:  same  PROCEDURE:  Procedure(s): RIGHT TOTAL HIP ARTHROPLASTY  PREOPERATIVE INDICATIONS:    Terry Chapman is an 67 y.o. male who has a diagnosis of right hip primary localized osteoarthritis and elected for surgical management after failing conservative treatment.  The risks benefits and alternatives were discussed with the patient including but not limited to the risks of nonoperative treatment, versus surgical intervention including infection, bleeding, nerve injury, periprosthetic fracture, the need for revision surgery, dislocation, leg length discrepancy, blood clots, cardiopulmonary complications, morbidity, mortality, among others, and they were willing to proceed.     OPERATIVE REPORT     SURGEON:  Marchia Bond, MD    ASSISTANT:  Joya Gaskins, OPA-C  (Present throughout the entire procedure,  necessary for completion of procedure in a timely manner, assisting with retraction, instrumentation, and closure)     ANESTHESIA: Spinal  ESTIMATED BLOOD LOSS: 700 mL    COMPLICATIONS:  None.     UNIQUE ASPECTS OF THE CASE: I had matching position of the acetabulum anteriorly and posteriorly, superiorly I had some uncoverage.  Interestingly, his right leg was substantially shorter than his left leg during examination preoperatively, although he did not have that much bone loss on his plain films.  I am not sure if he has a leg length discrepancy from another part of his leg.  Nonetheless after the implants were in, I basically restored him to the length he was preoperatively, may be just a little bit longer, and had excellent stability, and I did not think that I could go much longer because of the soft tissue tension on the capsular repair.  It came directly back down to bone.  The cup had a good stick, I did use a screw although the  screw purchase was to finger, but not as strong as I expected given his overall bone quality.  COMPONENTS:  [employer] fit femur size 4 with a 36 mm + 5 metallic head ball and a Gription Acetabular shell size 54, with a single cancellous screw for backup fixation, with an apex hole eliminator and a +4 neutral polyethylene liner.    PROCEDURE IN DETAIL:   The patient was met in the holding area and  identified.  The appropriate hip was identified and marked at the operative site.  The patient was then transported to the OR  and  placed under anesthesia.  At that point, the patient was  placed in the lateral decubitus position with the operative side up and  secured to the operating room table and all bony prominences padded.     The operative lower extremity was prepped from the iliac crest to the distal leg.  Sterile draping was performed.  Time out was performed prior to incision.      A routine posterolateral approach was utilized via sharp dissection  carried down to the subcutaneous tissue.  Gross bleeders were Bovie coagulated.  The iliotibial band was identified and incised along the length of the skin incision.  Self-retaining retractors were  inserted.  With the hip internally rotated, the short external rotators  were identified. The piriformis and capsule was tagged with FiberWire, and the hip capsule released in a T-type fashion.  The femoral neck was exposed, and I resected the femoral neck using the appropriate jig. This was performed at approximately a thumb's breadth above  the lesser trochanter.    I then exposed the deep acetabulum, cleared out any tissue including the ligamentum teres.  A wing retractor was placed.  After adequate visualization, I excised the labrum, and then sequentially reamed.  I placed the trial acetabulum, which seated nicely, and then impacted the real cup into place.  Appropriate version and inclination was confirmed clinically matching their bony  anatomy, and also with the use of the jig.  I placed a cancellous screw to augment fixation.  A trial polyethylene liner was placed and the wing retractor removed.    I then prepared the proximal femur using the cookie-cutter, the lateralizing reamer, and then sequentially reamed and broached.  A trial broach, neck, and head was utilized, and I reduced the hip and it was found to have excellent stability with functional range of motion. The trial components were then removed, and the real polyethylene liner was placed.  I then impacted the real femoral prosthesis into place into the appropriate version, slightly anteverted to the normal anatomy, and I impacted the real head ball into place. The hip was then reduced and taken through functional range of motion and found to have excellent stability. Leg lengths were restored.  I then used a 2 mm drill bits to pass the FiberWire suture from the capsule and piriformis through the greater trochanter, and secured this. Excellent posterior capsular repair was achieved. I also closed the T in the capsule.  I then irrigated the hip copiously again with pulse lavage, and repaired the fascia with Vicryl, followed by Vicryl for the subcutaneous tissue, Monocryl for the skin, Steri-Strips and sterile gauze. The wounds were injected. The patient was then awakened and returned to PACU in stable and satisfactory condition. There were no complications.  Marchia Bond, MD Orthopedic Surgeon [phone]   12/25/2017 12:21 PM

## 2017-12-25 NOTE — Transfer of Care (Signed)
Immediate Anesthesia Transfer of Care Note  Patient: Terry Chapman  Procedure(s) Performed: RIGHT TOTAL HIP ARTHROPLASTY (Right Hip)  Patient Location: PACU  Anesthesia Type:Spinal and MAC combined with regional for post-op pain  Level of Consciousness: awake, alert , oriented and patient cooperative  Airway & Oxygen Therapy: Patient Spontanous Breathing and Patient connected to nasal cannula oxygen  Post-op Assessment: Report given to RN, Post -op Vital signs reviewed and stable and Patient moving all extremities X 4  Post vital signs: Reviewed and stable  Last Vitals:  Vitals Value Taken Time  BP 119/85 12/25/2017 12:46 PM  Temp    Pulse 66 12/25/2017 12:48 PM  Resp 13 12/25/2017 12:48 PM  SpO2 100 % 12/25/2017 12:48 PM  Vitals shown include unvalidated device data.  Last Pain:  Vitals:   12/25/17 0831  TempSrc:   PainSc: 3       Patients Stated Pain Goal: 3 (38/17/71 1657)  Complications: No apparent anesthesia complications

## 2017-12-25 NOTE — Plan of Care (Signed)

## 2017-12-25 NOTE — Progress Notes (Signed)
Pt assisted to bathroom, dangled at bedside, denied dizziness, nausea and pain. Pt walked 3 steps forward and started to get dizzy and nauseous, assisted to floor by NT and RN, no injuries noted, vital signs taken and recorded. Pt on posterior hip precautions, assisted to chair by staff, dressing clean,dry and intact, vital signs taken and recorded. Pt stated he feels better upon sitting on the chair. Informed Pt's family about the incident. Will continue to monitor.

## 2017-12-25 NOTE — H&P (Signed)
PREOPERATIVE H&P  Chief Complaint: Right hip pain  HPI: Terry Chapman is a 67 y.o. male who presents for preoperative history and physical with a diagnosis of right hip primary localized osteoarthritis. Symptoms are rated as moderate to severe, and have been worsening.  This is significantly impairing activities of daily living.  He has elected for surgical management.   He has failed injections, activity modification, anti-inflammatories, and assistive devices.  Preoperative X-rays demonstrate end stage degenerative changes with osteophyte formation, loss of joint space, subchondral sclerosis.   Past Medical History:  Diagnosis Date  . GERD (gastroesophageal reflux disease)   . Hyperlipidemia   . Hyperthyroidism    Past Surgical History:  Procedure Laterality Date  . COLONOSCOPY    . ESOPHAGOGASTRODUODENOSCOPY     Social History   Socioeconomic History  . Marital status: Married    Spouse name: Not on file  . Number of children: 2  . Years of education: Not on file  . Highest education level: Not on file  Occupational History  . Occupation: Civil engineer, contracting  Social Needs  . Financial resource strain: Not on file  . Food insecurity:    Worry: Not on file    Inability: Not on file  . Transportation needs:    Medical: Not on file    Non-medical: Not on file  Tobacco Use  . Smoking status: Never Smoker  . Smokeless tobacco: Never Used  Substance and Sexual Activity  . Alcohol use: No    Alcohol/week: 0.0 oz  . Drug use: No  . Sexual activity: Not on file  Lifestyle  . Physical activity:    Days per week: Not on file    Minutes per session: Not on file  . Stress: Not on file  Relationships  . Social connections:    Talks on phone: Not on file    Gets together: Not on file    Attends religious service: Not on file    Active member of club or organization: Not on file    Attends meetings of clubs or organizations: Not on file    Relationship status: Not on file   Other Topics Concern  . Not on file  Social History Narrative   Civil Engineer - [employer]   [employer]   Married   stepchildren   Family History  Problem Relation Age of Onset  . Hypertension Father   . Alcohol abuse Father   . Colon cancer Father   . Hyperlipidemia Sister   . Hyperlipidemia Sister   . Heart attack Neg Hx    No Known Allergies Prior to Admission medications   Medication Sig Start Date End Date Taking? Authorizing Provider  cholecalciferol (VITAMIN D) 1000 units tablet Take 1,000 Units by mouth daily with lunch.   Yes [provider]  esomeprazole (NEXIUM) 20 MG capsule Take 20 mg by mouth daily as needed (for heartburn/indigestion).    Yes [provider]  ibuprofen (ADVIL,MOTRIN) 200 MG tablet Take 400-600 mg by mouth 2 (two) times daily as needed (for pain.).   Yes [provider]  levothyroxine (SYNTHROID, LEVOTHROID) 25 MCG tablet Take 25 mcg by mouth daily before breakfast. 12/07/17  Yes [provider]  rosuvastatin (CRESTOR) 5 MG tablet Take 5 mg by mouth every evening.    Yes [provider]     Positive ROS: All other systems have been reviewed and were otherwise negative with the exception of those mentioned in the HPI and as above.  Physical  Exam: General: Alert, no acute distress Cardiovascular: No pedal edema Respiratory: No cyanosis, no use of accessory musculature GI: No organomegaly, abdomen is soft and non-tender Skin: No lesions in the area of chief complaint Neurologic: Sensation intact distally Psychiatric: Patient is competent for consent with normal mood and affect Lymphatic: No axillary or cervical lymphadenopathy  MUSCULOSKELETAL: Right hip active motion 0 to 85 degrees with a painful arc, internal rotation 5 degrees, external rotation to 10 degrees, EHL intact.  Assessment: Right hip primary localized osteoarthritis   Plan: Plan for Procedure(s): RIGHT TOTAL HIP  ARTHROPLASTY  The risks benefits and alternatives were discussed with the patient including but not limited to the risks of nonoperative treatment, versus surgical intervention including infection, bleeding, nerve injury, periprosthetic fracture, the need for revision surgery, dislocation, leg length discrepancy, blood clots, cardiopulmonary complications, morbidity, mortality, among others, and they were willing to proceed.     Preoperative templating of the joint replacement has been completed, documented, and submitted to the Operating Room personnel in order to optimize intra-operative equipment management.  Johnny Bridge, MD Cell [phone]   12/25/2017 10:05 AM

## 2017-12-26 ENCOUNTER — Other Ambulatory Visit: Payer: Self-pay

## 2017-12-26 ENCOUNTER — Encounter (HOSPITAL_COMMUNITY): Payer: Self-pay | Admitting: General Practice

## 2017-12-26 LAB — BASIC METABOLIC PANEL
ANION GAP: 6 (ref 5–15)
BUN: 10 mg/dL (ref 8–23)
CHLORIDE: 102 mmol/L (ref 98–111)
CO2: 28 mmol/L (ref 22–32)
Calcium: 8.4 mg/dL — ABNORMAL LOW (ref 8.9–10.3)
Creatinine, Ser: 0.94 mg/dL (ref 0.61–1.24)
GFR calc Af Amer: >60 mL/min (ref >60)
GFR calc non Af Amer: >60 mL/min (ref >60)
GLUCOSE: 131 mg/dL — AB (ref 70–99)
POTASSIUM: 3.7 mmol/L (ref 3.5–5.1)
Sodium: 136 mmol/L (ref 135–145)

## 2017-12-26 LAB — CBC
HEMATOCRIT: 37.6 % — AB (ref 39.0–52.0)
HEMOGLOBIN: 12.7 g/dL — AB (ref 13.0–17.0)
MCH: 30.1 pg (ref 26.0–34.0)
MCHC: 33.8 g/dL (ref 30.0–36.0)
MCV: 89.1 fL (ref 78.0–100.0)
PLATELETS: 196 10*3/uL (ref 150–400)
RBC: 4.22 MIL/uL (ref 4.22–5.81)
RDW: 12.6 % (ref 11.5–15.5)
WBC: 10.3 10*3/uL (ref 4.0–10.5)

## 2017-12-26 NOTE — Progress Notes (Signed)
Physical Therapy Treatment Patient Details Name: Terry Chapman MRN: [id number] DOB: 12-01-1950 Today's Date: 12/26/2017    History of Present Illness Pt is a 67 y/o male s/p R THA with posterior hip precautions. PMH including but not limited to HLD and hyperthyroidism.    PT Comments    Pt making excellent progress with functional mobility. Focus of session was on LE strengthening HEP (please see below). Pt would continue to benefit from skilled physical therapy services at this time while admitted and after d/c to address the below listed limitations in order to improve overall safety and independence with functional mobility.   Follow Up Recommendations  Supervision - Intermittent;Home health PT     Equipment Recommendations  None recommended by PT    Recommendations for Other Services       Precautions / Restrictions Precautions Precautions: Posterior Hip Precaution Booklet Issued: Yes (comment) Precaution Comments: reviewed all precautions with pt and pt's wife Restrictions Weight Bearing Restrictions: Yes RLE Weight Bearing: Weight bearing as tolerated    Mobility  Bed Mobility               General bed mobility comments: pt OOB in recliner chair upon arrival  Transfers Overall transfer level: Needs assistance Equipment used: Rolling walker (2 wheeled) Transfers: Sit to/from Stand Sit to Stand: Supervision         General transfer comment: cueing for safe hand placement, supervision for safety  Ambulation/Gait             General Gait Details: focus of session was on HEP   Stairs             Wheelchair Mobility    Modified Rankin (Stroke Patients Only)       Balance Overall balance assessment: Needs assistance Sitting-balance support: Feet supported Sitting balance-Leahy Scale: Good     Standing balance support: During functional activity;No upper extremity supported Standing balance-Leahy Scale: Fair                               Cognition Arousal/Alertness: Awake/alert Behavior During Therapy: WFL for tasks assessed/performed Overall Cognitive Status: Within Functional Limits for tasks assessed                                        Exercises Total Joint Exercises Quad Sets: AROM;Strengthening;Right;10 reps;Seated Heel Slides: AROM;Strengthening;Right;10 reps;Seated Hip ABduction/ADduction: AROM;Strengthening;Right;10 reps;Seated;Standing(x10 sitting, x10 standing) Straight Leg Raises: AAROM;Right;10 reps;Seated Long Arc Quad: AROM;Strengthening;Right;10 reps;Seated Knee Flexion: AROM;Strengthening;Right;10 reps;Standing Marching in Standing: AROM;Strengthening;Both;10 reps;Standing General Exercises - Lower Extremity Mini-Sqauts: AROM;Strengthening;Both;10 reps;Standing    General Comments        Pertinent Vitals/Pain Pain Assessment: No/denies pain    Home Living                      Prior Function            PT Goals (current goals can now be found in the care plan section) Acute Rehab PT Goals PT Goal Formulation: With patient Time For Goal Achievement: 01/09/18 Potential to Achieve Goals: Good Progress towards PT goals: Progressing toward goals    Frequency    7X/week      PT Plan Current plan remains appropriate    Co-evaluation              AM-PAC PT "6  Clicks" Daily Activity  Outcome Measure  Difficulty turning over in bed (including adjusting bedclothes, sheets and blankets)?: None Difficulty moving from lying on back to sitting on the side of the bed? : None Difficulty sitting down on and standing up from a chair with arms (e.g., wheelchair, bedside commode, etc,.)?: Unable Help needed moving to and from a bed to chair (including a wheelchair)?: None Help needed walking in hospital room?: None Help needed climbing 3-5 steps with a railing? : A Little 6 Click Score: 20    End of Session   Activity Tolerance: Patient  tolerated treatment well Patient left: in chair;with call bell/phone within reach;with family/visitor present Nurse Communication: Mobility status PT Visit Diagnosis: Other abnormalities of gait and mobility (R26.89);Pain Pain - Right/Left: Right Pain - part of body: Hip     Time: 3151-7616 PT Time Calculation (min) (ACUTE ONLY): 10 min  Charges:  $Therapeutic Exercise: 8-22 mins                    G Codes:       [place], [place], [place] 12/26/2017, 2:35 PM

## 2017-12-26 NOTE — Progress Notes (Signed)
Discharge instructions completed with pt. Pt verbalized understanding of the information.  Pt denies chest pain, shortness of breath, dizziness, lightheadedness, and n/v.  Pt discharged home.  

## 2017-12-26 NOTE — Care Management Note (Signed)
Case Management Note  Patient Details  Name: Terry Chapman MRN: [id number] Date of Birth: 1951/01/19  Subjective/Objective: 67 yr old gentleman s/p right total hip arthroplasty.                    Action/Plan: Case manager spoke with patient and his wife concerning discharge plan and DME. Patient was preoperatively setup with [facility], no changes. He has RW, 3in1 and a cane. Will have family support at discharge.    Expected Discharge Date:  12/26/17               Expected Discharge Plan:  [place]  In-House Referral:  NA  Discharge planning Services  CM Consult  Post Acute Care Choice:  Home Health Choice offered to:  Patient, Spouse  DME Arranged:  (has RW,3in1 and a cane) DME Agency:  NA  HH Arranged:  PT [place] Agency:  [facility] (formerly [facility])  Status of Service:  Completed, signed off  If discussed at [employer], dates discussed:    Additional Comments:  Ninfa Meeker, RN 12/26/2017, 11:11 AM

## 2017-12-26 NOTE — Evaluation (Signed)
Physical Therapy Evaluation Patient Details Name: Terry Chapman MRN: [id number] DOB: [birth year] Today's Date: 12/26/2017   History of Present Illness  Pt is a 67 y/o male s/p R THA with posterior hip precautions. PMH including but not limited to HLD and hyperthyroidism.    Clinical Impression  Pt presented supine in bed with HOB elevated, awake and willing to participate in therapy session. Prior to admission, pt reported that he was independent and active, enjoys playing tennis. Pt currently able to perform bed mobility with supervision, transfers with supervision and ambulate in hallway with RW and min guard. Pt moving very well this morning. PT to see for a second session this afternoon prior to pt d/c'ing home.   Pt would continue to benefit from skilled physical therapy services at this time while admitted and after d/c to address the below listed limitations in order to improve overall safety and independence with functional mobility.     Follow Up Recommendations Supervision - Intermittent;Home health PT    Equipment Recommendations  None recommended by PT    Recommendations for Other Services       Precautions / Restrictions Precautions Precautions: Posterior Hip Precaution Booklet Issued: Yes (comment) Precaution Comments: reviewed all precautions with pt and pt's wife Restrictions Weight Bearing Restrictions: Yes RLE Weight Bearing: Weight bearing as tolerated      Mobility  Bed Mobility Overal bed mobility: Needs Assistance Bed Mobility: Supine to Sit     Supine to sit: Supervision     General bed mobility comments: increased time, supervision for safety  Transfers Overall transfer level: Needs assistance Equipment used: Rolling walker (2 wheeled) Transfers: Sit to/from Stand Sit to Stand: Supervision         General transfer comment: cueing for safe hand placement, supervision for safety  Ambulation/Gait Ambulation/Gait assistance: Min guard Gait  Distance (Feet): 200 Feet Assistive device: Rolling walker (2 wheeled) Gait Pattern/deviations: Step-to pattern;Step-through pattern;Decreased step length - right;Decreased step length - left;Decreased stride length Gait velocity: decreased Gait velocity interpretation: 1.31 - 2.62 ft/sec, indicative of limited community ambulator General Gait Details: pt initially with step-to pattern but progressing quickly to more of an even reciprocal gait pattern. At end of session, pt attempting to take a few steps without RW or UE supports  Financial trader Rankin (Stroke Patients Only)       Balance Overall balance assessment: Needs assistance Sitting-balance support: Feet supported Sitting balance-Leahy Scale: Good     Standing balance support: During functional activity;No upper extremity supported Standing balance-Leahy Scale: Fair                               Pertinent Vitals/Pain Pain Assessment: No/denies pain    Home Living Family/patient expects to be discharged to:: Private residence Living Arrangements: Spouse/significant other Available Help at Discharge: Family;Available 24 hours/day Type of Home: House Home Access: Level entry     Home Layout: One level Home Equipment: Cane - single point;Walker - 2 wheels;Bedside commode      Prior Function Level of Independence: Independent               Hand Dominance        Extremity/Trunk Assessment   Upper Extremity Assessment Upper Extremity Assessment: Overall WFL for tasks assessed    Lower Extremity Assessment Lower Extremity Assessment: RLE deficits/detail RLE Deficits / Details:  pt with decreased strength and ROM limitations secondary to post-op pain and weakness    Cervical / Trunk Assessment Cervical / Trunk Assessment: Normal  Communication   Communication: No difficulties  Cognition Arousal/Alertness: Awake/alert Behavior During Therapy: WFL  for tasks assessed/performed Overall Cognitive Status: Within Functional Limits for tasks assessed                                        General Comments General comments (skin integrity, edema, etc.): pt's wife present throughout session, attentive and encouraging    Exercises     Assessment/Plan    PT Assessment Patient needs continued PT services  PT Problem List Decreased strength;Decreased range of motion;Decreased activity tolerance;Decreased balance;Decreased mobility;Decreased coordination;Decreased knowledge of use of DME;Decreased knowledge of precautions;Decreased safety awareness;Pain       PT Treatment Interventions DME instruction;Gait training;Stair training;Functional mobility training;Therapeutic activities;Therapeutic exercise;Balance training;Neuromuscular re-education;Patient/family education    PT Goals (Current goals can be found in the Care Plan section)  Acute Rehab PT Goals Patient Stated Goal: return home today PT Goal Formulation: With patient Time For Goal Achievement: 01/09/18 Potential to Achieve Goals: Good    Frequency 7X/week   Barriers to discharge        Co-evaluation               AM-PAC PT "6 Clicks" Daily Activity  Outcome Measure Difficulty turning over in bed (including adjusting bedclothes, sheets and blankets)?: None Difficulty moving from lying on back to sitting on the side of the bed? : None Difficulty sitting down on and standing up from a chair with arms (e.g., wheelchair, bedside commode, etc,.)?: Unable Help needed moving to and from a bed to chair (including a wheelchair)?: None Help needed walking in hospital room?: A Little Help needed climbing 3-5 steps with a railing? : A Little 6 Click Score: 19    End of Session Equipment Utilized During Treatment: Gait belt Activity Tolerance: Patient tolerated treatment well Patient left: in chair;with call bell/phone within reach;with family/visitor  present Nurse Communication: Mobility status PT Visit Diagnosis: Other abnormalities of gait and mobility (R26.89);Pain Pain - Right/Left: Right Pain - part of body: Hip    Time: 9211-9417 PT Time Calculation (min) (ACUTE ONLY): 23 min   Charges:   PT Evaluation $PT Eval Moderate Complexity: 1 Mod PT Treatments $Gait Training: 8-22 mins   PT G Codes:        [place], PT, DPT [place] 12/26/2017, 12:16 PM

## 2017-12-26 NOTE — Anesthesia Postprocedure Evaluation (Signed)
Anesthesia Post Note  Patient: Terry Chapman  Procedure(s) Performed: RIGHT TOTAL HIP ARTHROPLASTY (Right Hip)     Patient location during evaluation: PACU Anesthesia Type: Spinal Level of consciousness: oriented and awake and alert Pain management: pain level controlled Vital Signs Assessment: post-procedure vital signs reviewed and stable Respiratory status: spontaneous breathing, respiratory function stable and patient connected to nasal cannula oxygen Cardiovascular status: blood pressure returned to baseline and stable Postop Assessment: no headache, no backache, no apparent nausea or vomiting and spinal receding Anesthetic complications: no    Last Vitals:  Vitals:   12/26/17 0028 12/26/17 0407  BP: 123/80 123/77  Pulse: 68 74  Resp: 16 16  Temp: 37 C 37 C  SpO2: 98% 97%    Last Pain:  Vitals:   12/26/17 1318  TempSrc:   PainSc: 0-No pain                 Effie Berkshire

## 2017-12-26 NOTE — Discharge Summary (Signed)
Physician Discharge Summary  Patient ID: Terry Chapman MRN: [id number] DOB/AGE: 1950/09/27 67 y.o.  Admit date: 12/25/2017 Discharge date: 12/26/2017  Admission Diagnoses:  Primary localized osteoarthritis of right hip  Discharge Diagnoses:  Principal Problem:   Primary localized osteoarthritis of right hip Active Problems:   Primary localized osteoarthritis of hip   Past Medical History:  Diagnosis Date  . GERD (gastroesophageal reflux disease)   . Hyperlipidemia   . Hyperthyroidism   . Primary localized osteoarthritis of right hip 12/25/2017    Surgeries: Procedure(s): RIGHT TOTAL HIP ARTHROPLASTY on 12/25/2017   Consultants (if any):   Discharged Condition: Improved  Hospital Course: Terry Chapman is an 67 y.o. male who was admitted 12/25/2017 with a diagnosis of Primary localized osteoarthritis of right hip and went to the operating room on 12/25/2017 and underwent the above named procedures.    He was given perioperative antibiotics:  Anti-infectives (From admission, onward)   Start     Dose/Rate Route Frequency Ordered Stop   12/25/17 1645  ceFAZolin (ANCEF) IVPB 2g/100 mL premix     2 g 200 mL/hr over 30 Minutes Intravenous Every 6 hours 12/25/17 1633 12/25/17 2344   12/25/17 1633  ceFAZolin (ANCEF) 2-4 GM/100ML-% IVPB    Note to Pharmacy:  [facility]   : cabinet override      12/25/17 1633 12/25/17 2312   12/25/17 0815  ceFAZolin (ANCEF) IVPB 2g/100 mL premix     2 g 200 mL/hr over 30 Minutes Intravenous On call to O.R. 12/25/17 0813 12/25/17 1057    .  He was given sequential compression devices, early ambulation, and asa for DVT prophylaxis.  He had one episode immediately out of pacu where he "settled" to the ground with 2 person assist from light headedness.  No injury.  He benefited maximally from the hospital stay and there were no complications.    Recent vital signs:  Vitals:   12/26/17 0028 12/26/17 0407  BP: 123/80 123/77  Pulse: 68 74  Resp: 16 16   Temp: 98.6 F (37 C) 98.6 F (37 C)  SpO2: 98% 97%    Recent laboratory studies:  Lab Results  Component Value Date   HGB 12.7 (L) 12/26/2017   HGB 15.3 12/14/2017   Lab Results  Component Value Date   WBC 10.3 12/26/2017   PLT 196 12/26/2017   No results found for: INR Lab Results  Component Value Date   NA 136 12/26/2017   K 3.7 12/26/2017   CL 102 12/26/2017   CO2 28 12/26/2017   BUN 10 12/26/2017   CREATININE 0.94 12/26/2017   GLUCOSE 131 (H) 12/26/2017    Discharge Medications:   Allergies as of 12/26/2017   No Known Allergies     Medication List    STOP taking these medications   ibuprofen 200 MG tablet Commonly known as:  ADVIL,MOTRIN     TAKE these medications   aspirin EC 325 MG tablet Take 1 tablet (325 mg total) by mouth 2 (two) times daily. To prevent blood clots   baclofen 10 MG tablet Commonly known as:  LIORESAL Take 1 tablet (10 mg total) by mouth 3 (three) times daily. As needed for muscle spasm   cholecalciferol 1000 units tablet Commonly known as:  VITAMIN D Take 1,000 Units by mouth daily with lunch.   esomeprazole 20 MG capsule Commonly known as:  NEXIUM Take 20 mg by mouth daily as needed (for heartburn/indigestion).   HYDROcodone-acetaminophen 10-325 MG tablet Commonly known as:  NORCO Take 1 tablet by mouth every 6 (six) hours as needed.   levothyroxine 25 MCG tablet Commonly known as:  SYNTHROID, LEVOTHROID Take 25 mcg by mouth daily before breakfast.   ondansetron 4 MG tablet Commonly known as:  ZOFRAN Take 1 tablet (4 mg total) by mouth every 8 (eight) hours as needed for nausea or vomiting.   rosuvastatin 5 MG tablet Commonly known as:  CRESTOR Take 5 mg by mouth every evening.   sennosides-docusate sodium 8.6-50 MG tablet Commonly known as:  SENOKOT-S Take 2 tablets by mouth daily.       Diagnostic Studies: Dg Hip Port Unilat With Pelvis 1v Right  Result Date: 12/25/2017 CLINICAL DATA:  Status post right hip  joint prosthesis placement. EXAM: DG HIP (WITH OR WITHOUT PELVIS) 1V PORT RIGHT COMPARISON:  None. FINDINGS: The patient has undergone right total hip joint prosthesis placement. The positioning of the prosthetic components is good. The interface with the native bone appears normal. There is no acute native bone abnormality. IMPRESSION: No immediate complication following right total hip joint prosthesis placement. Electronically Signed   By: David  Martinique M.D.   On: 12/25/2017 13:09    Disposition:     Follow-up Information    Marchia Bond, MD. Schedule an appointment as soon as possible for a visit in 2 weeks.   Specialty:  Orthopedic Surgery Contact information: [address] [place] [address] [phone]            Signed: Johnny Bridge 12/26/2017, 8:15 AM

## 2018-01-11 DIAGNOSIS — M1611 Unilateral primary osteoarthritis, right hip: Secondary | ICD-10-CM | POA: Diagnosis not present

## 2018-01-21 DIAGNOSIS — D225 Melanocytic nevi of trunk: Secondary | ICD-10-CM | POA: Diagnosis not present

## 2018-01-21 DIAGNOSIS — L723 Sebaceous cyst: Secondary | ICD-10-CM | POA: Diagnosis not present

## 2018-01-21 DIAGNOSIS — L720 Epidermal cyst: Secondary | ICD-10-CM | POA: Diagnosis not present

## 2018-01-21 DIAGNOSIS — L570 Actinic keratosis: Secondary | ICD-10-CM | POA: Diagnosis not present

## 2018-03-21 DIAGNOSIS — E039 Hypothyroidism, unspecified: Secondary | ICD-10-CM | POA: Diagnosis not present

## 2018-06-14 DIAGNOSIS — M7711 Lateral epicondylitis, right elbow: Secondary | ICD-10-CM | POA: Diagnosis not present

## 2018-07-24 DIAGNOSIS — Z23 Encounter for immunization: Secondary | ICD-10-CM | POA: Diagnosis not present

## 2018-08-30 DIAGNOSIS — H43811 Vitreous degeneration, right eye: Secondary | ICD-10-CM | POA: Diagnosis not present

## 2018-08-30 DIAGNOSIS — H40013 Open angle with borderline findings, low risk, bilateral: Secondary | ICD-10-CM | POA: Diagnosis not present

## 2019-05-01 IMAGING — DX DG HIP (WITH OR WITHOUT PELVIS) 1V PORT*R*
3 series · 3 of 3 positions shown · non-contrast
Comparison: None.

CLINICAL DATA: Status post right hip joint prosthesis placement.

EXAM:
DG HIP (WITH OR WITHOUT PELVIS) 1V PORT RIGHT

[hip ap]
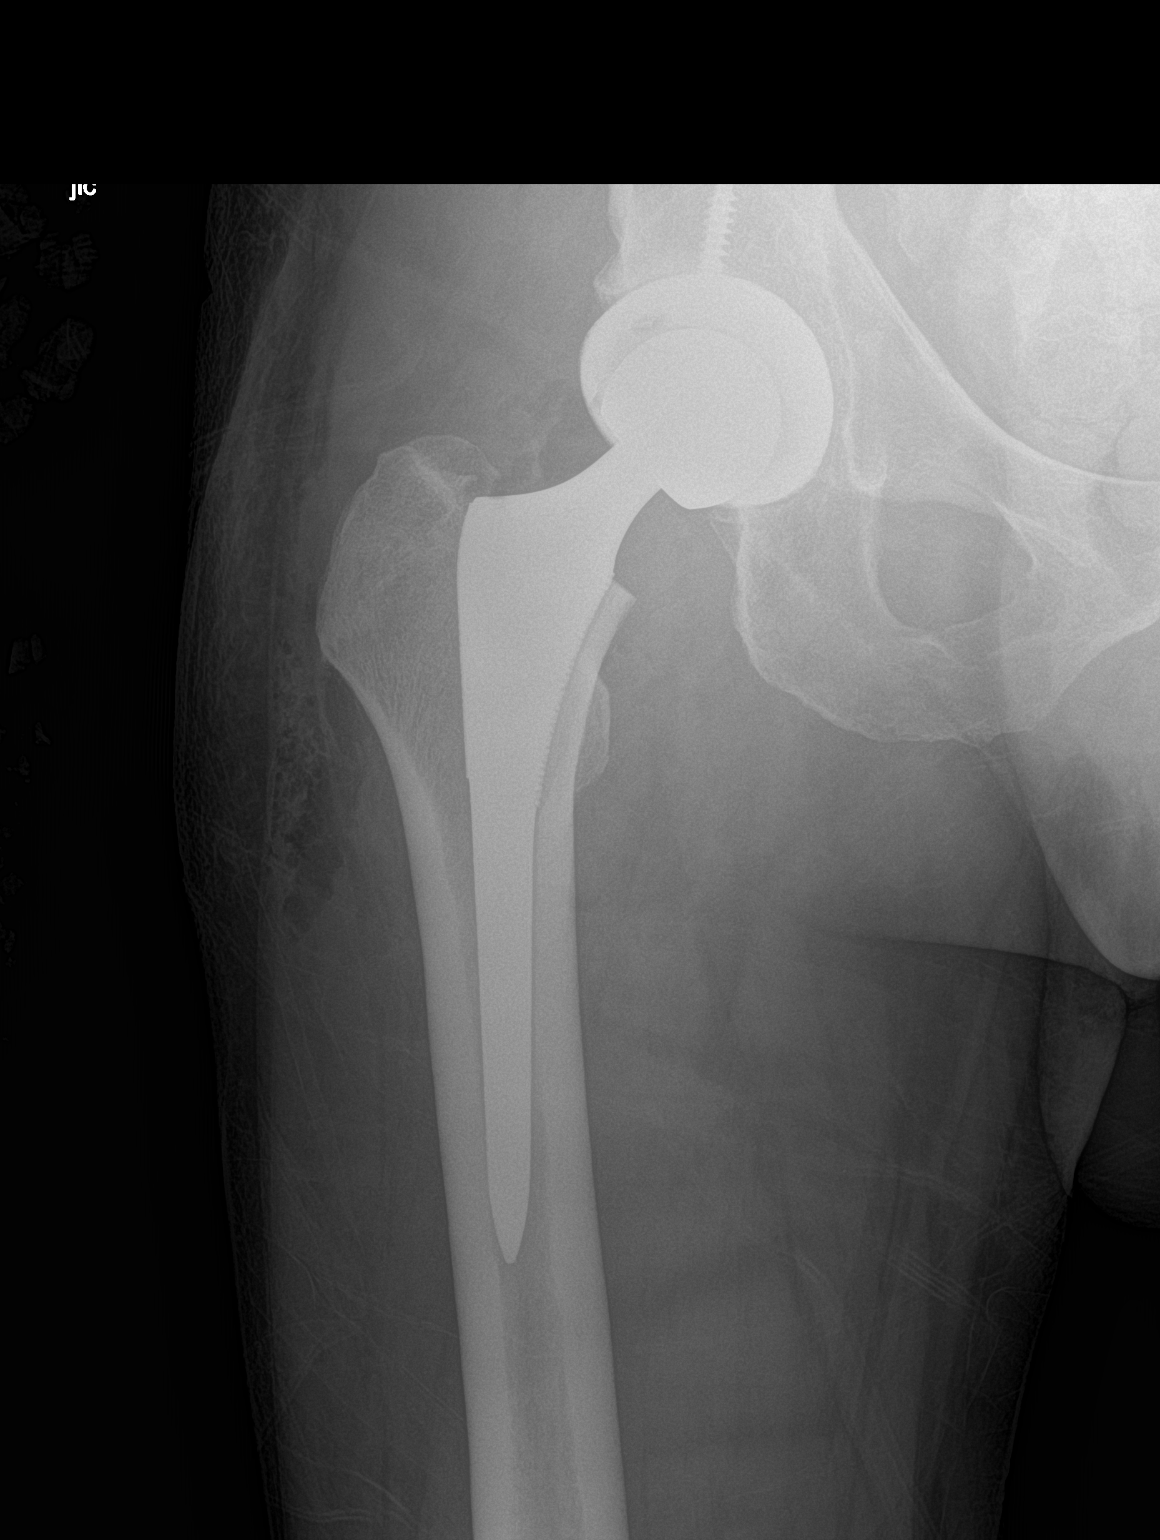

[hip lat]
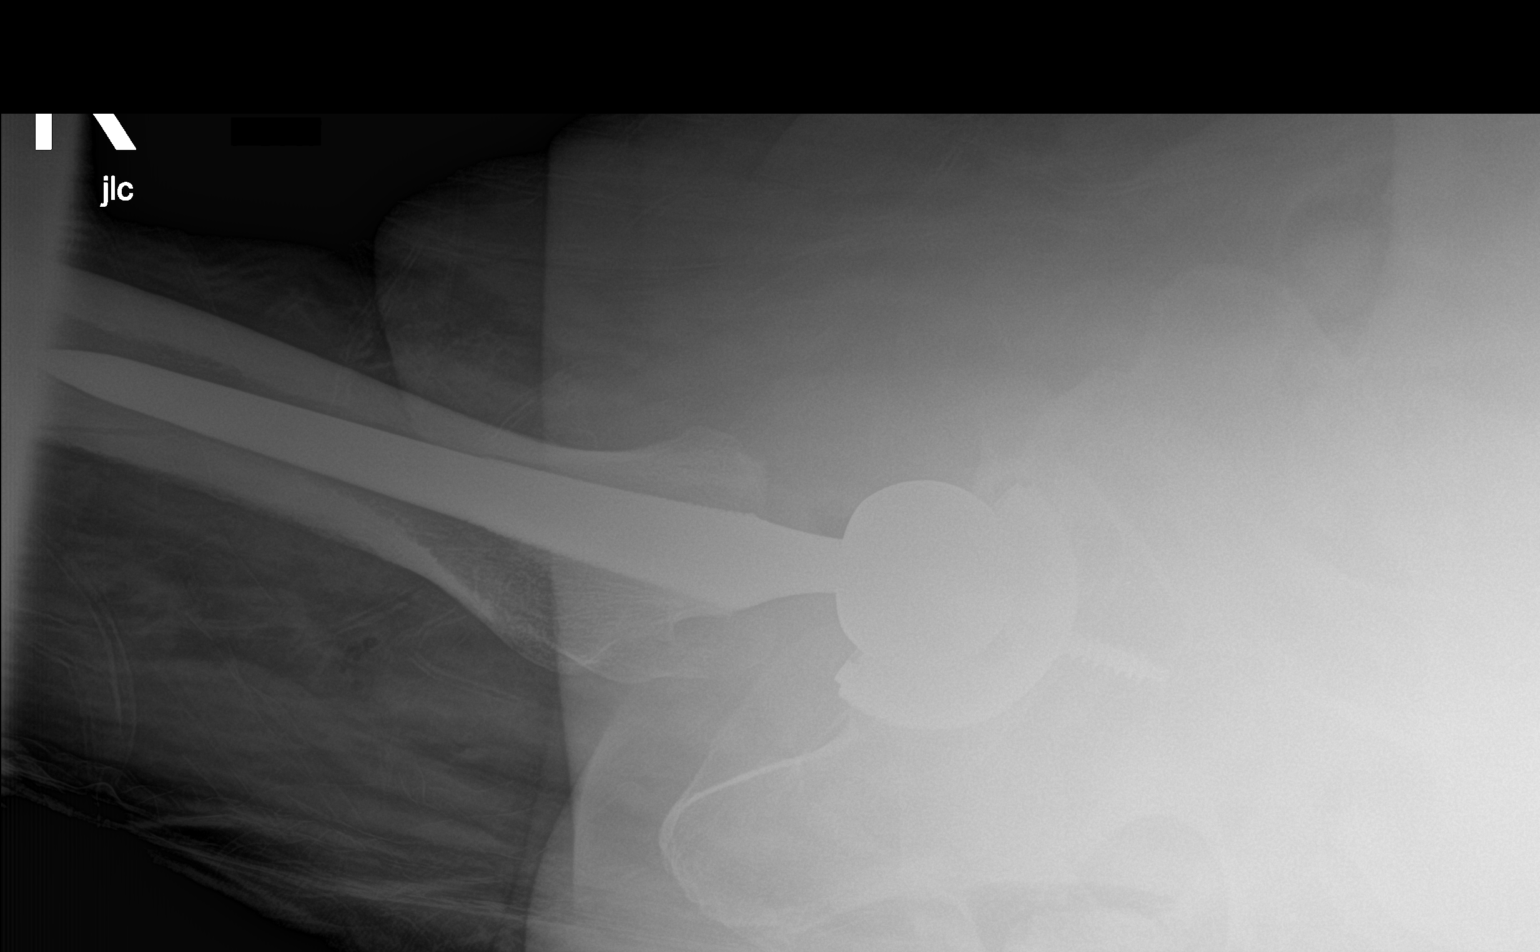

[pelvis ap]
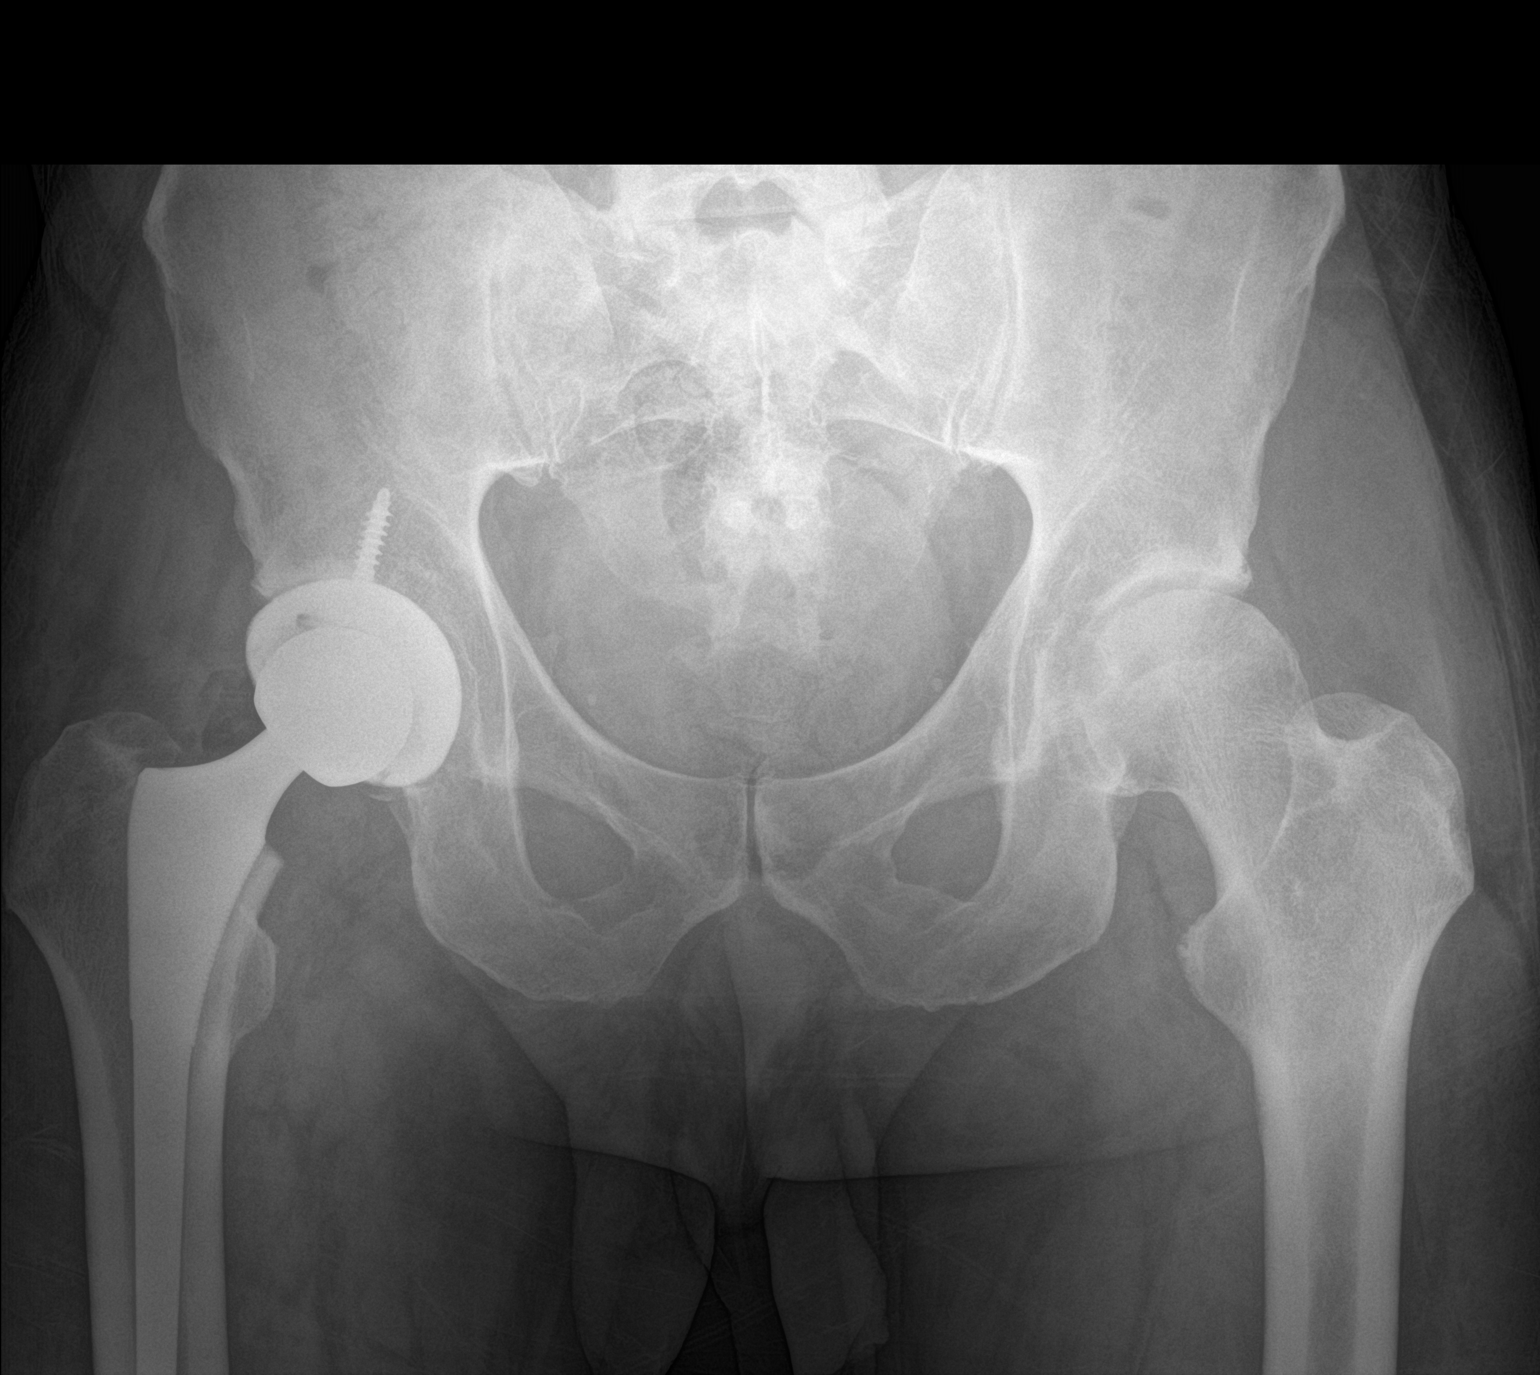

[3 of 3 positions shown; findings below may reference images not displayed]

FINDINGS: The patient has undergone right total hip joint prosthesis
placement. The positioning of the prosthetic components is good. The
interface with the native bone appears normal. There is no acute
native bone abnormality.
IMPRESSION: No immediate complication following right total hip joint prosthesis
placement.

## 2019-07-25 ENCOUNTER — Ambulatory Visit: Payer: 59

## 2019-07-31 ENCOUNTER — Ambulatory Visit: Payer: 59 | Attending: Internal Medicine

## 2019-07-31 DIAGNOSIS — Z23 Encounter for immunization: Secondary | ICD-10-CM | POA: Insufficient documentation

## 2019-07-31 NOTE — Progress Notes (Signed)
   Covid-19 Vaccination Clinic  Name:  Terry Chapman    MRN: [id number] DOB: [birth year]  07/31/2019  [name] was observed post Covid-19 immunization for 15 minutes without incidence. He was provided with Vaccine Information Sheet and instruction to access the V-Safe system.   [name] was instructed to call 911 with any severe reactions post vaccine: Marland Kitchen Difficulty breathing  . Swelling of your face and throat  . A fast heartbeat  . A bad rash all over your body  . Dizziness and weakness    Immunizations Administered    Name Date Dose VIS Date Route   Pfizer COVID-19 Vaccine 07/31/2019  8:20 AM 0.3 mL 06/06/2019 Intramuscular   Manufacturer: [place]   Lot: [id number]   [place]: [id number]

## 2019-08-15 ENCOUNTER — Ambulatory Visit: Payer: 59

## 2019-08-25 ENCOUNTER — Ambulatory Visit: Payer: 59 | Attending: Internal Medicine

## 2019-08-25 DIAGNOSIS — Z23 Encounter for immunization: Secondary | ICD-10-CM | POA: Insufficient documentation

## 2019-08-25 NOTE — Progress Notes (Signed)
   Covid-19 Vaccination Clinic  Name:  Terry Chapman    MRN: [id number] DOB: 10/14/1950  08/25/2019  [name] was observed post Covid-19 immunization for 15 minutes without incidence. He was provided with Vaccine Information Sheet and instruction to access the V-Safe system.   [name] was instructed to call 911 with any severe reactions post vaccine: Marland Kitchen Difficulty breathing  . Swelling of your face and throat  . A fast heartbeat  . A bad rash all over your body  . Dizziness and weakness    Immunizations Administered    Name Date Dose VIS Date Route   Pfizer COVID-19 Vaccine 08/25/2019  8:26 AM 0.3 mL 06/06/2019 Intramuscular   Manufacturer: [place]   Lot: [id number]   [place]: [id number]

## 2019-12-13 ENCOUNTER — Emergency Department
Admission: EM | Admit: 2019-12-13 | Discharge: 2019-12-13 | Disposition: A | Discharge status: Home / Self Care | Payer: 59 | Attending: Emergency Medicine | Admitting: Emergency Medicine

## 2019-12-13 ENCOUNTER — Other Ambulatory Visit: Payer: Self-pay

## 2019-12-13 DIAGNOSIS — R55 Syncope and collapse: Secondary | ICD-10-CM | POA: Diagnosis present

## 2019-12-13 DIAGNOSIS — T675XXA Heat exhaustion, unspecified, initial encounter: Secondary | ICD-10-CM

## 2019-12-13 DIAGNOSIS — Y9373 Activity, racquet and hand sports: Secondary | ICD-10-CM | POA: Insufficient documentation

## 2019-12-13 LAB — CBC
HCT: 42.3 % (ref 39.0–52.0)
Hemoglobin: 15.1 g/dL (ref 13.0–17.0)
MCH: 30.9 pg (ref 26.0–34.0)
MCHC: 35.7 g/dL (ref 30.0–36.0)
MCV: 86.5 fL (ref 80.0–100.0)
Platelets: 214 10*3/uL (ref 150–400)
RBC: 4.89 MIL/uL (ref 4.22–5.81)
RDW: 12.8 % (ref 11.5–15.5)
WBC: 10.1 10*3/uL (ref 4.0–10.5)
nRBC: 0 % (ref 0.0–0.2)

## 2019-12-13 LAB — COMPREHENSIVE METABOLIC PANEL
ALT: 20 U/L (ref 0–44)
AST: 26 U/L (ref 15–41)
Albumin: 3.9 g/dL (ref 3.5–5.0)
Alkaline Phosphatase: 54 U/L (ref 38–126)
Anion gap: 8 (ref 5–15)
BUN: 17 mg/dL (ref 8–23)
CO2: 24 mmol/L (ref 22–32)
Calcium: 9 mg/dL (ref 8.9–10.3)
Chloride: 108 mmol/L (ref 98–111)
Creatinine, Ser: 1.44 mg/dL — ABNORMAL HIGH (ref 0.61–1.24)
GFR calc Af Amer: 58 mL/min — ABNORMAL LOW (ref >60)
GFR calc non Af Amer: 50 mL/min — ABNORMAL LOW (ref >60)
Glucose, Bld: 130 mg/dL — ABNORMAL HIGH (ref 70–99)
Potassium: 3.6 mmol/L (ref 3.5–5.1)
Sodium: 140 mmol/L (ref 135–145)
Total Bilirubin: 1.2 mg/dL (ref 0.3–1.2)
Total Protein: 6.7 g/dL (ref 6.5–8.1)

## 2019-12-13 LAB — TROPONIN I (HIGH SENSITIVITY): Troponin I (High Sensitivity): 7 ng/L (ref <18)

## 2019-12-13 MED ORDER — SODIUM CHLORIDE 0.9 % IV SOLN
1000.0000 mL | Freq: Once | INTRAVENOUS | Status: AC
  Administered 2019-12-13: 1000 mL via INTRAVENOUS

## 2019-12-13 NOTE — ED Triage Notes (Signed)
Pt comes EMS from tennis courts. Pt became very hot, got nauseous, projectile vomited a couple of times. Pt has had 4mg  zofran and 1047ml NS with EMS. Pt AOx4. Pt never stopped sweating.  CBG 118.

## 2019-12-13 NOTE — ED Provider Notes (Signed)
[facility] Emergency Department Provider Note   ____________________________________________    I have reviewed the triage vital signs and the nursing notes.   HISTORY  Chief Complaint Heat Exposure     HPI Terry Chapman is a 69 y.o. male with history as noted below who presents after a syncopal episode.  Patient reports that he was playing tennis in the 95 degree heat today.  By the end of the third set he was starting to feel lightheaded.  Afterwards he went to sit down and thinks that he may have syncopized.  When he came to he felt nauseated sweaty and vomited once.  He denies any chest pain.  No shortness of breath.  No abdominal pain.  Received 500 cc IV fluids from EMS with improvement.  Currently is feeling much better.  He reports similar thing happened about 10 years ago, also while playing tennis  Past Medical History:  Diagnosis Date  . GERD (gastroesophageal reflux disease)   . Hyperlipidemia   . Hyperthyroidism   . Primary localized osteoarthritis of right hip 12/25/2017    Patient Active Problem List   Diagnosis Date Noted  . Primary localized osteoarthritis of right hip 12/25/2017  . Primary localized osteoarthritis of hip 12/25/2017  . GERD (gastroesophageal reflux disease)   . Hyperlipidemia     Past Surgical History:  Procedure Laterality Date  . COLONOSCOPY    . ESOPHAGOGASTRODUODENOSCOPY    . 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 Authorizing Provider  aspirin EC 325 MG tablet Take 1 tablet (325 mg total) by mouth 2 (two) times daily. To prevent blood clots 12/25/17   Marchia Bond, MD  baclofen (LIORESAL) 10 MG tablet Take 1 tablet (10 mg total) by mouth 3 (three) times  daily. As needed for muscle spasm 12/25/17   Marchia Bond, MD  cholecalciferol (VITAMIN D) 1000 units tablet Take 1,000 Units by mouth daily with lunch.    [provider]  esomeprazole (NEXIUM) 20 MG capsule Take 20 mg by mouth daily as needed (for heartburn/indigestion).     [provider]  HYDROcodone-acetaminophen (NORCO) 10-325 MG tablet Take 1 tablet by mouth every 6 (six) hours as needed. 12/25/17   Marchia Bond, MD  levothyroxine (SYNTHROID, LEVOTHROID) 25 MCG tablet Take 25 mcg by mouth daily before breakfast. 12/07/17   [provider]  ondansetron (ZOFRAN) 4 MG tablet Take 1 tablet (4 mg total) by mouth every 8 (eight) hours as needed for nausea or vomiting. 12/25/17   Marchia Bond, MD  rosuvastatin (CRESTOR) 5 MG tablet Take 5 mg by mouth every evening.     [provider]  sennosides-docusate sodium (SENOKOT-S) 8.6-50 MG tablet Take 2 tablets by mouth daily. 12/25/17   Marchia Bond, MD     Allergies Patient has no known allergies.  Family History  Problem Relation Age of Onset  . Hypertension Father   . Alcohol abuse Father   . Colon cancer Father   . Hyperlipidemia Sister   . Hyperlipidemia Sister   . Heart attack Neg Hx     Social History Social History   Tobacco Use  . Smoking status: Never Smoker  . Smokeless tobacco: Never Used  Vaping Use  . Vaping Use:  Never used  Substance Use Topics  . Alcohol use: No    Alcohol/week: 0.0 standard drinks  . Drug use: No    Review of Systems  Constitutional: No fever/chills Eyes: No visual changes.  ENT: No sore throat. Cardiovascular: Denies chest pain. Respiratory: Denies shortness of breath. Gastrointestinal: No abdominal pain.  As above Genitourinary: Negative for dysuria. Musculoskeletal: Negative for back pain. Skin: Negative for rash. Neurological: Negative for headaches    ____________________________________________   PHYSICAL EXAM:  VITAL SIGNS: ED Triage  Vitals  Enc Vitals Group     BP 12/13/19 1401 135/82     Pulse Rate 12/13/19 1359 63     Resp 12/13/19 1359 18     Temp 12/13/19 1403 (!) 97.3 F (36.3 C)     Temp Source 12/13/19 1403 Oral     SpO2 12/13/19 1359 100 %     Weight 12/13/19 1400 77.1 kg (170 lb)     Height 12/13/19 1400 1.753 m (5\' 9" )     Head Circumference --      Peak Flow --      Pain Score 12/13/19 1359 0     Pain Loc --      Pain Edu? --      Excl. in [place]? --     Constitutional: Alert and oriented.   Nose: No congestion/rhinnorhea. Mouth/Throat: Mucous membranes are moist.    Cardiovascular: Normal rate, regular rhythm. Grossly normal heart sounds.  Good peripheral circulation. Respiratory: Normal respiratory effort.  No retractions. Lungs CTAB. Gastrointestinal: Soft and nontender. No distention.  No CVA tenderness.  Musculoskeletal: .  Warm and well perfused Neurologic:  Normal speech and language. No gross focal neurologic deficits are appreciated.  Skin:  Skin is warm, dry and intact. No rash noted. Psychiatric: Mood and affect are normal. Speech and behavior are normal.  ____________________________________________   LABS (all labs ordered are listed, but only abnormal results are displayed)  Labs Reviewed  COMPREHENSIVE METABOLIC PANEL - Abnormal; Notable for the following components:      Result Value   Glucose, Bld 130 (*)    Creatinine, Ser 1.44 (*)    GFR calc non Af Amer 50 (*)    GFR calc Af Amer 58 (*)    All other components within normal limits  CBC  COMPREHENSIVE METABOLIC PANEL  CBC  TROPONIN I (HIGH SENSITIVITY)  TROPONIN I (HIGH SENSITIVITY)   ____________________________________________  EKG  ED ECG REPORT I, Lavonia Drafts, the attending physician, personally viewed and interpreted this ECG.  Date: 12/13/2019  Rhythm: normal sinus rhythm QRS Axis: normal Intervals: normal ST/T Wave abnormalities: Nonspecific ST changes Narrative Interpretation: Nonspecific  changes  ____________________________________________  RADIOLOGY None ____________________________________________   PROCEDURES  Procedure(s) performed: No  Procedures   Critical Care performed: No ____________________________________________   INITIAL IMPRESSION / ASSESSMENT AND PLAN / ED COURSE  Pertinent labs & imaging results that were available during my care of the patient were reviewed by me and considered in my medical decision making (see chart for details).  Patient presents after syncopal episode after exertion and very hot temperatures.  Differential includes heat exhaustion, not consistent with heatstroke, electrolyte abnormalities, less likely ACS/arrhythmia  Feeling better with IV fluids.  Will give additional liter of IV fluids.  Pending electrolytes.   Lab work is overall quite reassuring, patient is feeling at his baseline.  Once fluid has finished infusing he is appropriate for discharge.   ____________________________________________   FINAL CLINICAL IMPRESSION(S) / ED DIAGNOSES  Final diagnoses:  Heat exhaustion, initial encounter        Note:  This document was prepared using Dragon voice recognition software and may include unintentional dictation errors.   Lavonia Drafts, MD 12/13/19 1455

## 2022-05-12 ENCOUNTER — Ambulatory Visit: Payer: Self-pay | Admitting: Surgery

## 2022-05-12 NOTE — H&P (Signed)
  Terry Chapman [id number]   Referring Provider:  Self   Subjective   Chief Complaint: No chief complaint on file.     History of Present Illness: Very pleasant 71 year old male with history of GERD, hyperlipidemia, hyperparathyroidism and arthritis who is following up for evaluation of a subcutaneous mass.  He was last seen in August and had noticed the lesion about 3 months prior.  This is in the right flank above the iliac crest.  He does not think it has changed significantly since her last visit.  It does bother him more from the standpoint that he thinks about it and is concerned about whether it needs a biopsy.     Review of Systems: A complete review of systems was obtained from the patient.  I have reviewed this information and discussed as appropriate with the patient.  See HPI as well for other ROS.   Medical History: Past Medical History:  Diagnosis Date   Arthritis    GERD (gastroesophageal reflux disease)    Hyperlipidemia    Thyroid disease     There is no problem list on file for this patient.   Past Surgical History:  Procedure Laterality Date   JOINT REPLACEMENT Right 2019   Hip     No Known Allergies  Current Outpatient Medications on File Prior to Visit  Medication Sig Dispense Refill   cholecalciferol (VITAMIN D3) 1000 unit tablet Take by mouth     CRESTOR 5 mg tablet 1 tablet     levothyroxine (SYNTHROID) 25 MCG tablet TAKE 1 TABLET BY MOUTH EVERY DAY IN THE MORNING ON EMPTY STOMACH FOR 90 DAYS     omeprazole (PRILOSEC) 20 MG DR capsule 1 capsule 30 minutes before morning meal     No current facility-administered medications on file prior to visit.    No family history on file.   Social History   Tobacco Use  Smoking Status Never  Smokeless Tobacco Never     Social History   Socioeconomic History   Marital status: Married  Tobacco Use   Smoking status: Never   Smokeless tobacco: Never  Substance and Sexual Activity   Alcohol use:  Not Currently   Drug use: Never    Objective:    There were no vitals filed for this visit.  There is no height or weight on file to calculate BMI.  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  At this point it has increased in size slightly and is causing him concern.  I recommend we proceed with excision. Discussed technique of excision and risks of bleeding, infection, pain, scarring, injury to subjacent structures, hematoma/seroma, wound healing problems, recurrence.  Questions welcomed and answered.  Patient wishes to proceed with scheduling.  Zenon Leaf [name], MD

## 2022-05-31 NOTE — Progress Notes (Signed)
COVID Vaccine Completed: yes  Date of COVID positive in last 90 days:  PCP - Lona Kettle, MD Cardiologist - Richardson Dopp LOV 08/03/15  Chest x-ray -  EKG -  Stress Test - 08/11/15 Epic ECHO -  Cardiac Cath -  Pacemaker/ICD device last checked: Spinal Cord Stimulator:  Bowel Prep -   Sleep Study -  CPAP -   Fasting Blood Sugar -  Checks Blood Sugar _____ times a day  Last dose of GLP1 agonist-  N/A GLP1 instructions:  N/A   Last dose of SGLT-2 inhibitors-  N/A SGLT-2 instructions: N/A   Blood Thinner Instructions: Aspirin Instructions: Last Dose:  Activity level:  Can go up a flight of stairs and perform activities of daily living without stopping and without symptoms of chest pain or shortness of breath.  Able to exercise without symptoms  Unable to go up a flight of stairs without symptoms of     Anesthesia review:   Patient denies shortness of breath, fever, cough and chest pain at PAT appointment  Patient verbalized understanding of instructions that were given to them at the PAT appointment. Patient was also instructed that they will need to review over the PAT instructions again at home before surgery.

## 2022-05-31 NOTE — Patient Instructions (Signed)
SURGICAL WAITING ROOM VISITATION Patients having surgery or a procedure may have no more than 2 support people in the waiting area - these visitors may rotate.   Children under the age of 63 must have an adult with them who is not the patient. If the patient needs to stay at the hospital during part of their recovery, the visitor guidelines for inpatient rooms apply. Pre-op nurse will coordinate an appropriate time for 1 support person to accompany patient in pre-op.  This support person may not rotate.    Please refer to the [facility] website for the visitor guidelines for Inpatients (after your surgery is over and you are in a regular room).    Your procedure is scheduled on: 06/13/22   Report to [facility] Main Entrance    Report to admitting at 5:15 AM   Call this number if you have problems the morning of surgery [phone]   Do not eat food or drink fluids :After Midnight.          If you have questions, please contact your surgeon's office.   FOLLOW BOWEL PREP AND ANY ADDITIONAL PRE OP INSTRUCTIONS YOU RECEIVED FROM YOUR SURGEON'S OFFICE!!!     Oral Hygiene is also important to reduce your risk of infection.                                    Remember - BRUSH YOUR TEETH THE MORNING OF SURGERY WITH YOUR REGULAR TOOTHPASTE  DENTURES WILL BE REMOVED PRIOR TO SURGERY PLEASE DO NOT APPLY "Poly grip" OR ADHESIVES!!!   Take these medicines the morning of surgery with A SIP OF WATER: Nexium, Levothyroxine                               You may not have any metal on your body including jewelry, and body piercing             Do not wear lotions, powders, cologne, or deodorant              Men may shave face and neck.   Do not bring valuables to the hospital. [place].  DO NOT [place]. PHARMACY WILL DISPENSE MEDICATIONS LISTED ON YOUR MEDICATION LIST TO YOU DURING YOUR ADMISSION [place]!    Patients discharged on the day of surgery will not be allowed to drive home.  Someone NEEDS to stay with you for the first 24 hours after anesthesia.   Special Instructions: Bring a copy of your healthcare power of attorney and living will documents the day of surgery if you haven't scanned them before.              Please read over the following fact sheets you were given: IF [place] [phone] Schneiders    If you received a COVID test during your pre-op visit  it is requested that you wear a mask when out in public, stay away from anyone that may not be feeling well and notify your surgeon if you develop symptoms. If you test positive for Covid or have been in contact with anyone that has tested positive in the last 10 days please  notify you surgeon.    [place] - Preparing for Surgery Before surgery, you can play an important role.  Because skin is not sterile, your skin needs to be as free of germs as possible.  You can reduce the number of germs on your skin by washing with CHG (chlorahexidine gluconate) soap before surgery.  CHG is an antiseptic cleaner which kills germs and bonds with the skin to continue killing germs even after washing. Please DO NOT use if you have an allergy to CHG or antibacterial soaps.  If your skin becomes reddened/irritated stop using the CHG and inform your nurse when you arrive at Short Stay. Do not shave (including legs and underarms) for at least 48 hours prior to the first CHG shower.  You may shave your face/neck.  Please follow these instructions carefully:  1.  Shower with CHG Soap the night before surgery and the  morning of surgery.  2.  If you choose to wash your hair, wash your hair first as usual with your normal  shampoo.  3.  After you shampoo, rinse your hair and body thoroughly to remove the shampoo.                             4.  Use CHG as you would any other liquid soap.   You can apply chg directly to the skin and wash.  Gently with a scrungie or clean washcloth.  5.  Apply the CHG Soap to your body ONLY FROM THE NECK DOWN.   Do   not use on face/ open                           Wound or open sores. Avoid contact with eyes, ears mouth and   genitals (private parts).                       Wash face,  Genitals (private parts) with your normal soap.             6.  Wash thoroughly, paying special attention to the area where your    surgery  will be performed.  7.  Thoroughly rinse your body with warm water from the neck down.  8.  DO NOT shower/wash with your normal soap after using and rinsing off the CHG Soap.                9.  Pat yourself dry with a clean towel.            10.  Wear clean pajamas.            11.  Place clean sheets on your bed the night of your first shower and do not  sleep with pets. Day of Surgery : Do not apply any lotions/deodorants the morning of surgery.  Please wear clean clothes to the hospital/surgery center.  FAILURE TO FOLLOW THESE INSTRUCTIONS MAY RESULT IN THE CANCELLATION OF YOUR SURGERY  PATIENT SIGNATURE_________________________________  NURSE SIGNATURE__________________________________  ________________________________________________________________________

## 2022-06-01 ENCOUNTER — Encounter (HOSPITAL_COMMUNITY)
Admission: RE | Admit: 2022-06-01 | Discharge: 2022-06-01 | Disposition: A | Discharge status: Home / Self Care | Payer: 59 | Source: Ambulatory Visit | Attending: Surgery | Admitting: Surgery

## 2022-06-01 ENCOUNTER — Encounter (HOSPITAL_COMMUNITY): Payer: Self-pay

## 2022-06-01 VITALS — BP 153/91 | HR 57 | Temp 97.6°F | Resp 14 | Ht 69.0 in | Wt 183.0 lb

## 2022-06-01 DIAGNOSIS — Z01818 Encounter for other preprocedural examination: Secondary | ICD-10-CM | POA: Insufficient documentation

## 2022-06-01 LAB — CBC
HCT: 49 % (ref 39.0–52.0)
Hemoglobin: 16.1 g/dL (ref 13.0–17.0)
MCH: 30.1 pg (ref 26.0–34.0)
MCHC: 32.9 g/dL (ref 30.0–36.0)
MCV: 91.6 fL (ref 80.0–100.0)
Platelets: 212 10*3/uL (ref 150–400)
RBC: 5.35 MIL/uL (ref 4.22–5.81)
RDW: 12.9 % (ref 11.5–15.5)
WBC: 6.2 10*3/uL (ref 4.0–10.5)
nRBC: 0 % (ref 0.0–0.2)

## 2022-06-12 ENCOUNTER — Encounter (HOSPITAL_COMMUNITY): Payer: Self-pay | Admitting: Surgery

## 2022-06-13 ENCOUNTER — Other Ambulatory Visit: Payer: Self-pay

## 2022-06-13 ENCOUNTER — Ambulatory Visit (HOSPITAL_COMMUNITY)
Admission: RE | Admit: 2022-06-13 | Discharge: 2022-06-13 | Disposition: A | Discharge status: Home / Self Care | Payer: 59 | Source: Ambulatory Visit | Attending: Surgery | Admitting: Surgery

## 2022-06-13 ENCOUNTER — Ambulatory Visit (HOSPITAL_COMMUNITY): Payer: 59 | Admitting: Physician Assistant

## 2022-06-13 ENCOUNTER — Encounter (HOSPITAL_COMMUNITY)
Admission: RE | Disposition: A | Discharge status: Home / Self Care | Payer: Self-pay | Source: Ambulatory Visit | Attending: Surgery

## 2022-06-13 ENCOUNTER — Ambulatory Visit (HOSPITAL_BASED_OUTPATIENT_CLINIC_OR_DEPARTMENT_OTHER): Payer: 59 | Admitting: Certified Registered Nurse Anesthetist

## 2022-06-13 ENCOUNTER — Encounter (HOSPITAL_COMMUNITY): Payer: Self-pay | Admitting: Surgery

## 2022-06-13 DIAGNOSIS — E785 Hyperlipidemia, unspecified: Secondary | ICD-10-CM | POA: Diagnosis not present

## 2022-06-13 DIAGNOSIS — M1990 Unspecified osteoarthritis, unspecified site: Secondary | ICD-10-CM | POA: Diagnosis not present

## 2022-06-13 DIAGNOSIS — E079 Disorder of thyroid, unspecified: Secondary | ICD-10-CM | POA: Diagnosis not present

## 2022-06-13 DIAGNOSIS — E0590 Thyrotoxicosis, unspecified without thyrotoxic crisis or storm: Secondary | ICD-10-CM

## 2022-06-13 DIAGNOSIS — R222 Localized swelling, mass and lump, trunk: Secondary | ICD-10-CM | POA: Diagnosis present

## 2022-06-13 DIAGNOSIS — K219 Gastro-esophageal reflux disease without esophagitis: Secondary | ICD-10-CM | POA: Insufficient documentation

## 2022-06-13 HISTORY — PX: MASS EXCISION: SHX2000

## 2022-06-13 SURGERY — EXCISION MASS
Anesthesia: Monitor Anesthesia Care | Laterality: Right

## 2022-06-13 MED ORDER — PROPOFOL 500 MG/50ML IV EMUL
INTRAVENOUS | Status: DC | PRN
  Administered 2022-06-13: 75 ug/kg/min via INTRAVENOUS

## 2022-06-13 MED ORDER — OXYCODONE HCL 5 MG PO TABS: 5.0000 mg | ORAL_TABLET | ORAL | Status: DC | PRN

## 2022-06-13 MED ORDER — ONDANSETRON HCL 4 MG/2ML IJ SOLN
INTRAMUSCULAR | Status: DC | PRN
  Administered 2022-06-13: 4 mg via INTRAVENOUS

## 2022-06-13 MED ORDER — FENTANYL CITRATE PF 50 MCG/ML IJ SOSY: 25.0000 ug | PREFILLED_SYRINGE | INTRAMUSCULAR | Status: DC | PRN

## 2022-06-13 MED ORDER — KETOROLAC TROMETHAMINE 30 MG/ML IJ SOLN
INTRAMUSCULAR | Status: AC
  Filled 2022-06-13: qty 1

## 2022-06-13 MED ORDER — FENTANYL CITRATE (PF) 100 MCG/2ML IJ SOLN
INTRAMUSCULAR | Status: AC
  Filled 2022-06-13: qty 2

## 2022-06-13 MED ORDER — PROPOFOL 500 MG/50ML IV EMUL
INTRAVENOUS | Status: AC
  Filled 2022-06-13: qty 50

## 2022-06-13 MED ORDER — DEXAMETHASONE SODIUM PHOSPHATE 10 MG/ML IJ SOLN
INTRAMUSCULAR | Status: AC
  Filled 2022-06-13: qty 1

## 2022-06-13 MED ORDER — BUPIVACAINE-EPINEPHRINE 0.25% -1:200000 IJ SOLN
INTRAMUSCULAR | Status: DC | PRN
  Administered 2022-06-13: 19 mL

## 2022-06-13 MED ORDER — PHENYLEPHRINE 80 MCG/ML (10ML) SYRINGE FOR IV PUSH (FOR BLOOD PRESSURE SUPPORT)
PREFILLED_SYRINGE | INTRAVENOUS | Status: AC
  Filled 2022-06-13: qty 10

## 2022-06-13 MED ORDER — KETOROLAC TROMETHAMINE 15 MG/ML IJ SOLN: 15.0000 mg | Freq: Once | INTRAMUSCULAR | Status: DC | PRN

## 2022-06-13 MED ORDER — CHLORHEXIDINE GLUCONATE 4 % EX LIQD: 60.0000 mL | Freq: Once | CUTANEOUS | Status: DC

## 2022-06-13 MED ORDER — TRAMADOL HCL 50 MG PO TABS: 50.0000 mg | ORAL_TABLET | Freq: Four times a day (QID) | ORAL | 0 refills | Status: AC | PRN

## 2022-06-13 MED ORDER — ORAL CARE MOUTH RINSE: 15.0000 mL | Freq: Once | OROMUCOSAL | Status: AC

## 2022-06-13 MED ORDER — CHLORHEXIDINE GLUCONATE 0.12 % MT SOLN
15.0000 mL | Freq: Once | OROMUCOSAL | Status: AC
  Administered 2022-06-13: 15 mL via OROMUCOSAL

## 2022-06-13 MED ORDER — ACETAMINOPHEN 650 MG RE SUPP
650.0000 mg | RECTAL | Status: DC | PRN
  Filled 2022-06-13: qty 1

## 2022-06-13 MED ORDER — LIDOCAINE HCL (CARDIAC) PF 100 MG/5ML IV SOSY
PREFILLED_SYRINGE | INTRAVENOUS | Status: DC | PRN
  Administered 2022-06-13: 50 mg via INTRAVENOUS

## 2022-06-13 MED ORDER — EPHEDRINE SULFATE (PRESSORS) 50 MG/ML IJ SOLN
INTRAMUSCULAR | Status: DC | PRN
  Administered 2022-06-13: 10 mg via INTRAVENOUS

## 2022-06-13 MED ORDER — GABAPENTIN 300 MG PO CAPS
300.0000 mg | ORAL_CAPSULE | ORAL | Status: AC
  Administered 2022-06-13: 300 mg via ORAL
  Filled 2022-06-13: qty 1

## 2022-06-13 MED ORDER — ONDANSETRON HCL 4 MG/2ML IJ SOLN
INTRAMUSCULAR | Status: AC
  Filled 2022-06-13: qty 2

## 2022-06-13 MED ORDER — LACTATED RINGERS IV SOLN: INTRAVENOUS | Status: DC

## 2022-06-13 MED ORDER — BUPIVACAINE-EPINEPHRINE (PF) 0.5% -1:200000 IJ SOLN
INTRAMUSCULAR | Status: AC
  Filled 2022-06-13: qty 30

## 2022-06-13 MED ORDER — ACETAMINOPHEN 500 MG PO TABS
1000.0000 mg | ORAL_TABLET | ORAL | Status: AC
  Administered 2022-06-13: 1000 mg via ORAL

## 2022-06-13 MED ORDER — SODIUM CHLORIDE 0.9% FLUSH: 3.0000 mL | Freq: Two times a day (BID) | INTRAVENOUS | Status: DC

## 2022-06-13 MED ORDER — SODIUM CHLORIDE 0.9% FLUSH: 3.0000 mL | INTRAVENOUS | Status: DC | PRN

## 2022-06-13 MED ORDER — SODIUM CHLORIDE 0.9 % IV SOLN: 250.0000 mL | INTRAVENOUS | Status: DC | PRN

## 2022-06-13 MED ORDER — AMISULPRIDE (ANTIEMETIC) 5 MG/2ML IV SOLN: 10.0000 mg | Freq: Once | INTRAVENOUS | Status: DC | PRN

## 2022-06-13 MED ORDER — CEFAZOLIN SODIUM-DEXTROSE 2-4 GM/100ML-% IV SOLN
2.0000 g | INTRAVENOUS | Status: AC
  Administered 2022-06-13: 2 g via INTRAVENOUS
  Filled 2022-06-13: qty 100

## 2022-06-13 MED ORDER — LIDOCAINE HCL (PF) 2 % IJ SOLN
INTRAMUSCULAR | Status: AC
  Filled 2022-06-13: qty 5

## 2022-06-13 MED ORDER — ONDANSETRON HCL 4 MG/2ML IJ SOLN: 4.0000 mg | Freq: Once | INTRAMUSCULAR | Status: DC | PRN

## 2022-06-13 MED ORDER — ACETAMINOPHEN 325 MG PO TABS: 650.0000 mg | ORAL_TABLET | ORAL | Status: DC | PRN

## 2022-06-13 MED ORDER — FENTANYL CITRATE (PF) 100 MCG/2ML IJ SOLN
INTRAMUSCULAR | Status: DC | PRN
  Administered 2022-06-13: 50 ug via INTRAVENOUS

## 2022-06-13 MED ORDER — PROPOFOL 10 MG/ML IV BOLUS
INTRAVENOUS | Status: AC
  Filled 2022-06-13: qty 20

## 2022-06-13 MED ORDER — MIDAZOLAM HCL 5 MG/5ML IJ SOLN
INTRAMUSCULAR | Status: DC | PRN
  Administered 2022-06-13: 2 mg via INTRAVENOUS

## 2022-06-13 MED ORDER — PHENYLEPHRINE HCL (PRESSORS) 10 MG/ML IV SOLN
INTRAVENOUS | Status: DC | PRN
  Administered 2022-06-13: 80 ug via INTRAVENOUS

## 2022-06-13 MED ORDER — MIDAZOLAM HCL 2 MG/2ML IJ SOLN
INTRAMUSCULAR | Status: AC
  Filled 2022-06-13: qty 2

## 2022-06-13 SURGICAL SUPPLY — 34 items
ADH SKN CLS APL DERMABOND .7 (GAUZE/BANDAGES/DRESSINGS)
APL SKNCLS STERI-STRIP NONHPOA (GAUZE/BANDAGES/DRESSINGS) ×1
BAG COUNTER SPONGE SURGICOUNT (BAG) IMPLANT
BAG SPNG CNTER NS LX DISP (BAG)
BENZOIN TINCTURE PRP APPL 2/3 (GAUZE/BANDAGES/DRESSINGS) IMPLANT
COVER SURGICAL LIGHT HANDLE (MISCELLANEOUS) ×1 IMPLANT
DERMABOND ADVANCED .7 DNX12 (GAUZE/BANDAGES/DRESSINGS) IMPLANT
DRAPE LAPAROSCOPIC ABDOMINAL (DRAPES) IMPLANT
DRAPE LAPAROTOMY T 102X78X121 (DRAPES) IMPLANT
DRAPE LAPAROTOMY TRNSV 102X78 (DRAPES) IMPLANT
DRAPE UTILITY XL STRL (DRAPES) ×1 IMPLANT
ELECT REM PT RETURN 15FT ADLT (MISCELLANEOUS) ×1 IMPLANT
GAUZE SPONGE 4X4 12PLY STRL (GAUZE/BANDAGES/DRESSINGS) ×1 IMPLANT
GLOVE BIO SURGEON STRL SZ 6 (GLOVE) ×1 IMPLANT
GLOVE INDICATOR 6.5 STRL GRN (GLOVE) ×1 IMPLANT
GOWN STRL REUS W/ TWL LRG LVL3 (GOWN DISPOSABLE) ×1 IMPLANT
GOWN STRL REUS W/ TWL XL LVL3 (GOWN DISPOSABLE) IMPLANT
GOWN STRL REUS W/TWL LRG LVL3 (GOWN DISPOSABLE) ×1
GOWN STRL REUS W/TWL XL LVL3 (GOWN DISPOSABLE)
KIT BASIN OR (CUSTOM PROCEDURE TRAY) ×1 IMPLANT
KIT TURNOVER KIT A (KITS) IMPLANT
MARKER SKIN DUAL TIP RULER LAB (MISCELLANEOUS) IMPLANT
NEEDLE HYPO 22GX1.5 SAFETY (NEEDLE) ×1 IMPLANT
PACK GENERAL/GYN (CUSTOM PROCEDURE TRAY) ×1 IMPLANT
SPIKE FLUID TRANSFER (MISCELLANEOUS) IMPLANT
STAPLER VISISTAT 35W (STAPLE) IMPLANT
SUT MNCRL AB 4-0 PS2 18 (SUTURE) ×1 IMPLANT
SUT VIC AB 3-0 SH 27 (SUTURE) ×1
SUT VIC AB 3-0 SH 27XBRD (SUTURE) ×1 IMPLANT
SYR CONTROL 10ML LL (SYRINGE) ×1 IMPLANT
TAPE CLOTH SURG 6X10 WHT LF (GAUZE/BANDAGES/DRESSINGS) IMPLANT
TAPE STRIPS DRAPE STRL (GAUZE/BANDAGES/DRESSINGS) IMPLANT
TOWEL OR 17X26 10 PK STRL BLUE (TOWEL DISPOSABLE) ×1 IMPLANT
TOWEL OR NON WOVEN STRL DISP B (DISPOSABLE) ×1 IMPLANT

## 2022-06-13 NOTE — Anesthesia Postprocedure Evaluation (Signed)
Anesthesia Post Note  Patient: Keyon Liller  Procedure(s) Performed: EXCISION SUBCUTANEOUS MASS RIGHT FLANK (Right)     Patient location during evaluation: PACU Anesthesia Type: MAC Level of consciousness: awake Pain management: pain level controlled Vital Signs Assessment: post-procedure vital signs reviewed and stable Respiratory status: spontaneous breathing, nonlabored ventilation and respiratory function stable Cardiovascular status: blood pressure returned to baseline and stable Postop Assessment: no apparent nausea or vomiting Anesthetic complications: no   No notable events documented.  Last Vitals:  Vitals:   06/13/22 0900 06/13/22 0915  BP: 132/89 (!) 120/96  Pulse: (!) 53 (!) 52  Resp:  18  Temp:    SpO2: 97% 98%    Last Pain:  Vitals:   06/13/22 0915  TempSrc:   PainSc: 0-No pain                 Ronny Ruddell P Catalaya Garr

## 2022-06-13 NOTE — Discharge Instructions (Signed)
GENERAL SURGERY: POST OP INSTRUCTIONS  EAT Gradually transition to a high fiber diet with a fiber supplement over the next few weeks after discharge.  Start with a pureed / full liquid diet (see below)  WALK Walk an hour a day (cumulative, not all at once).  Control your pain to do that.    CONTROL PAIN Control pain so that you can walk, sleep, tolerate sneezing/coughing, go up/down stairs.  HAVE A BOWEL MOVEMENT DAILY Keep your bowels regular to avoid problems.  OK to try a laxative to override constipation.  OK to use an antidiarrheal to slow down diarrhea.  Call if not better after 2 tries  CALL IF YOU HAVE PROBLEMS/CONCERNS Call if you are still struggling despite following these instructions. Call if you have concerns not answered by these instructions    DIET: Follow a light bland diet & liquids the first 24 hours after arrival home, such as soup, liquids, starches, etc.  Be sure to drink plenty of fluids.  Quickly advance to a usual solid diet within a few days.  Avoid fast food or heavy meals initially as you are more likely to get nauseated or have irregular bowels.  Take your usually prescribed home medications unless otherwise directed. PAIN CONTROL: Pain is best controlled by a usual combination of three different methods TOGETHER: Ice/Heat Over the counter pain medication Prescription pain medication Most patients will experience some swelling and bruising around the incisions.  Ice packs or heating pads (30-60 minutes up to 6 times a day) will help. Use ice for the first few days to help decrease swelling and bruising, then switch to heat to help relax tight/sore spots and speed recovery.  Some people prefer to use ice alone, heat alone, alternating between ice & heat.  Experiment to what works for you.  Swelling and bruising can take several weeks to resolve.   It is helpful to take an over-the-counter pain medication regularly for the first few weeks.  Choose one of the  following that works best for you: Naproxen (Aleve, etc)  Two '220mg'$  tabs twice a day OR Ibuprofen (Advil, etc) Three '200mg'$  tabs four times a day (every meal & bedtime) AND Acetaminophen (Tylenol, etc) 500-'650mg'$  four times a day (every meal & bedtime) A  prescription for pain medication (such as oxycodone, hydrocodone, etc) should be given to you upon discharge.  Take your pain medication as prescribed, if needed.  If you are having problems/concerns with the prescription medicine (does not control pain, nausea, vomiting, rash, itching, etc), please call us [phone] to see if we need to switch you to a different pain medicine that will work better for you and/or control your side effect better. If you need a refill on your pain medication, please contact your pharmacy.  They will contact our office to request authorization. Prescriptions will not be filled after 5 pm or on week-ends. Avoid getting constipated.  Between the surgery and the pain medications, it is common to experience some constipation.  Increasing fluid intake and taking a fiber supplement (such as Metamucil, Citrucel, FiberCon, MiraLax, etc) 1-2 times a day regularly will usually help prevent this problem from occurring.  A mild laxative (prune juice, Milk of Magnesia, MiraLax, etc) should be taken according to package directions if there are no bowel movements after 48 hours.   Wash / shower every day, starting 2 days after surgery.  You may shower over the steri strips as they are waterproof.  Continue to shower over  incision(s) after the dressing is off. No rubbing, scrubbing, lotions or ointments to incision. Do not soak or submerge incision. Remove your outer bandage 2 days after surgery; steri strips will peel off after 1-2 weeks.  You may leave the incision open to air.  You may replace a dressing/Band-Aid to cover the incision for comfort if you wish.   ACTIVITIES as tolerated:   You may resume regular (light) daily  activities beginning the next day--such as daily self-care, walking, climbing stairs--gradually increasing activities as tolerated.  If you can walk 30 minutes without difficulty, it is safe to try more intense activity such as jogging, treadmill, bicycling, low-impact aerobics, swimming, etc. Save the most intensive and strenuous activity for last such as sit-ups, heavy lifting, contact sports, etc  Refrain from any heavy lifting or straining until you are off narcotics for pain control.   DO NOT PUSH THROUGH PAIN.  Let pain be your guide: If it hurts to do something, don't do it.  Pain is your body warning you to avoid that activity for another week until the pain goes down. You may drive when you are no longer taking prescription pain medication, you can comfortably wear a seatbelt, and you can safely maneuver your car and apply brakes. You may have sexual intercourse when it is comfortable.  FOLLOW UP in our office Please call [facility] at [phone] to set up an appointment to see your surgeon in the office for a follow-up appointment approximately 2-3 weeks after your surgery. Make sure that you call for this appointment the day you arrive home to insure a convenient appointment time. 9. IF YOU HAVE DISABILITY OR FAMILY LEAVE FORMS, BRING THEM TO THE OFFICE FOR PROCESSING.  DO NOT GIVE THEM TO YOUR DOCTOR.   WHEN TO CALL us [phone]: Poor pain control Reactions / problems with new medications (rash/itching, nausea, etc)  Fever over 101.5 F (38.5 C) Worsening swelling or bruising Continued bleeding from incision. Increased pain, redness, or drainage from the incision Difficulty breathing / swallowing   The clinic staff is available to answer your questions during regular business hours (8:30am-5pm).  Please don't hesitate to call and ask to speak to one of our nurses for clinical concerns.   If you have a medical emergency, go to the nearest emergency room or call 911.  A surgeon from  [facility] is always on call at the [facility], [place], [place], [place], [place]  [address] ? MAIN: [phone] ? TOLL FREE: [phone] ?  FAX [phone] [email or web address]

## 2022-06-13 NOTE — Anesthesia Preprocedure Evaluation (Addendum)
Anesthesia Evaluation  Patient identified by MRN, date of birth, ID band Patient awake    Reviewed: Allergy & Precautions, NPO status , Patient's Chart, lab work & pertinent test results  Airway Mallampati: II  TM Distance: >3 FB Neck ROM: Full    Dental no notable dental hx.    Pulmonary neg pulmonary ROS   Pulmonary exam normal        Cardiovascular negative cardio ROS Normal cardiovascular exam     Neuro/Psych negative neurological ROS  negative psych ROS   GI/Hepatic Neg liver ROS,GERD  Medicated and Controlled,,  Endo/Other   Hyperthyroidism   Renal/GU negative Renal ROS     Musculoskeletal  (+) Arthritis ,    Abdominal   Peds  Hematology negative hematology ROS (+)   Anesthesia Other Findings SUBCUTANEOUS MASS  Reproductive/Obstetrics                             Anesthesia Physical Anesthesia Plan  ASA: 2  Anesthesia Plan: MAC   Post-op Pain Management:    Induction: Intravenous  PONV Risk Score and Plan: 1 and Ondansetron, Dexamethasone, Propofol infusion, Midazolam and Treatment may vary due to age or medical condition  Airway Management Planned: Simple Face Mask  Additional Equipment:   Intra-op Plan:   Post-operative Plan:   Informed Consent: I have reviewed the patients History and Physical, chart, labs and discussed the procedure including the risks, benefits and alternatives for the proposed anesthesia with the patient or authorized representative who has indicated his/her understanding and acceptance.     Dental advisory given  Plan Discussed with: CRNA  Anesthesia Plan Comments:        Anesthesia Quick Evaluation

## 2022-06-13 NOTE — Op Note (Signed)
Operative Note  Terry Chapman  [id number]  [id number]  06/13/2022   Surgeon: Romana Juniper MD FACS I was personally present during the key and critical portions of this procedure and immediately available throughout the entire procedure, as documented in my operative note.    Assistant: Sheldon Silvan MD ([id number])   Procedure performed: Excision of right flank partially intramuscular mass, 5 x 5 x 3 cm   Preop diagnosis: Subcutaneous mass Post-op diagnosis/intraop findings: Subcutaneous mass extending into the muscle tissue   Specimens: right Flank mass Retained items: No EBL: Minimal cc Complications: none   Description of procedure: After confirming informed consent the patient was taken to the operating room and placed in the left lateral decubitus position on the operating room table where MAC was initiated, preoperative antibiotics were administered, SCDs applied, and a formal timeout was performed.  The right flank was prepped and draped in usual sterile fashion.  After infiltration with local (half percent Marcaine with epinephrine) and approximately 4 cm incision was made along the Langer's lines in the right flank overlying the mass.  The soft tissues were dissected with cautery until the surface of the mass was encountered.  Combination of blunt dissection and cautery were then used to freed of its attachments to the surrounding soft tissue.  The deep margin of the mass was noted to extend into the muscle tissue, and a small amount of fascia and muscle was adherent to the mass once fully excised.  Hemostasis was ensured within the wound with cautery.  The incision was closed with interrupted deep dermal 3-0 Vicryl's followed by running subcuticular 4 Monocryl.  Benzoin, Steri-Strips and a light pressure dressing of 4 x 4's and tape was then applied.  The patient was then awakened, returned to the supine position and taken to PACU in stable condition.    All counts were correct at the  completion of the case.

## 2022-06-13 NOTE — H&P (Signed)
Terry Chapman [id number]    Referring Provider:  Self     Subjective    Chief Complaint: No chief complaint on file.       History of Present Illness: Very pleasant 71 year old male with history of GERD, hyperlipidemia, hyperparathyroidism and arthritis who is following up for evaluation of a subcutaneous mass.  He was last seen in August and had noticed the lesion about 3 months prior.  This is in the right flank above the iliac crest.  He does not think it has changed significantly since her last visit.  It does bother him more from the standpoint that he thinks about it and is concerned about whether it needs a biopsy.         Review of Systems: A complete review of systems was obtained from the patient.  I have reviewed this information and discussed as appropriate with the patient.  See HPI as well for other ROS.     Medical History:     Past Medical History:  Diagnosis Date   Arthritis     GERD (gastroesophageal reflux disease)     Hyperlipidemia     Thyroid disease        There is no problem list on file for this patient.          Past Surgical History:  Procedure Laterality Date   JOINT REPLACEMENT Right 2019    Hip      No Known Allergies         Current Outpatient Medications on File Prior to Visit  Medication Sig Dispense Refill   cholecalciferol (VITAMIN D3) 1000 unit tablet Take by mouth       CRESTOR 5 mg tablet 1 tablet       levothyroxine (SYNTHROID) 25 MCG tablet TAKE 1 TABLET BY MOUTH EVERY DAY IN THE MORNING ON EMPTY STOMACH FOR 90 DAYS       omeprazole (PRILOSEC) 20 MG DR capsule 1 capsule 30 minutes before morning meal        No current facility-administered medications on file prior to visit.      No family history on file.    Social History       Tobacco Use  Smoking Status Never  Smokeless Tobacco Never      Social History        Socioeconomic History   Marital status: Married  Tobacco Use   Smoking status: Never   Smokeless  tobacco: Never  Substance and Sexual Activity   Alcohol use: Not Currently   Drug use: Never      Objective:      There were no vitals filed for this visit.  There is no height or weight on file to calculate BMI.   Alert and well-appearing Unlabored respirations Smooth, mobile, ovoid subcutaneous mass in the right flank which is nontender, at this point it is closer to 4 to 5 cm in the long axis, parallel with his ribs, slightly increased in size from initial visit where it was about 3 cm long   Assessment and Plan:  Diagnoses and all orders for this visit:   Subcutaneous mass   This remains consistent with a lipoma on exam.  At this point it has increased in size slightly and is causing him concern.  I recommend we proceed with excision. Discussed technique of excision and risks of bleeding, infection, pain, scarring, injury to subjacent structures, hematoma/seroma, wound healing problems, recurrence.  Questions welcomed and answered.  Patient wishes to  proceed with scheduling.   Terry Chapman [name], MD

## 2022-06-13 NOTE — Transfer of Care (Signed)
Immediate Anesthesia Transfer of Care Note  Patient: Terry Chapman  Procedure(s) Performed: EXCISION SUBCUTANEOUS MASS RIGHT FLANK (Right)  Patient Location: PACU  Anesthesia Type:MAC  Level of Consciousness: awake, alert , and oriented  Airway & Oxygen Therapy: Patient Spontanous Breathing and Patient connected to face mask oxygen  Post-op Assessment: Report given to RN and Post -op Vital signs reviewed and stable  Post vital signs: Reviewed and stable  Last Vitals:  Vitals Value Taken Time  BP 125/76 06/13/22 0816  Temp    Pulse 62 06/13/22 0817  Resp 16 06/13/22 0817  SpO2 100 % 06/13/22 0817  Vitals shown include unvalidated device data.  Last Pain:  Vitals:   06/13/22 0609  TempSrc: Oral  PainSc:          Complications: No notable events documented.

## 2022-06-14 ENCOUNTER — Encounter (HOSPITAL_COMMUNITY): Payer: Self-pay | Admitting: Surgery

## 2022-06-14 LAB — SURGICAL PATHOLOGY

## 2022-10-24 ENCOUNTER — Other Ambulatory Visit (HOSPITAL_BASED_OUTPATIENT_CLINIC_OR_DEPARTMENT_OTHER): Payer: Self-pay | Admitting: Family Medicine

## 2022-10-24 DIAGNOSIS — Z0000 Encounter for general adult medical examination without abnormal findings: Secondary | ICD-10-CM

## 2022-10-25 ENCOUNTER — Encounter: Payer: Self-pay | Admitting: Neurology

## 2022-11-23 ENCOUNTER — Encounter (HOSPITAL_BASED_OUTPATIENT_CLINIC_OR_DEPARTMENT_OTHER): Payer: Self-pay

## 2022-11-23 ENCOUNTER — Ambulatory Visit (HOSPITAL_BASED_OUTPATIENT_CLINIC_OR_DEPARTMENT_OTHER)
Admission: RE | Admit: 2022-11-23 | Discharge: 2022-11-23 | Disposition: A | Discharge status: Home / Self Care | Payer: Self-pay | Source: Ambulatory Visit | Attending: Family Medicine | Admitting: Family Medicine

## 2022-11-23 DIAGNOSIS — Z0000 Encounter for general adult medical examination without abnormal findings: Secondary | ICD-10-CM | POA: Insufficient documentation

## 2022-12-08 NOTE — Progress Notes (Unsigned)
Assessment/Plan:   Dystonic Tremor (aka writers cramp)  -Discussed nature and pathophysiology.  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  We discussed treatment with Botox, but both of [place] agreed to that it is not time for the Botox, especially if things are overall getting better.  Certainly, if it gets worse in the future we could consider treatment with onobotulinum toxin.  For now, I would not recommend that.  Reassurance was provided that I do not see evidence of a neurodegenerative process like Parkinson's disease.  We are happy to reevaluate him in the future should things get worse or should new neurologic issues arise.  Subjective:   Terry Chapman was seen today in the movement disorders clinic for neurologic consultation at the request of Daisy Floro, MD.  The consultation is for the evaluation of vocal (pt doesn't notice this much)and hand tremor.  Records made available to me are reviewed.  Tremor: Yes.     How long has it been going on? Since his late 54's - its gotten better since its started  At rest or with activation?  activation  When is it noted the most?  Writing only  Fam hx of tremor?  Mom had head tremor  Located where?  R hand  Affected by caffeine: doesn't drink caffeine  Affected by alcohol:  doesn't drink alcohol  Affected by stress:  Yes.    Affected by fatigue:  Yes.    Spills soup if on spoon:  No.  Spills glass of liquid if full:  No.  Affects ADL's (tying shoes, brushing teeth, etc):  No.  Tremor inducing meds:  No.  Other Specific Symptoms:  Voice: as above Sleep: sleeps well  Vivid Dreams:  No. Postural symptoms:  No.  Falls?  No. Bradykinesia symptoms: no bradykinesia noted Loss of smell:  No. Loss of taste:  No. Urinary Incontinence:  No. Difficulty Swallowing:  only troubles if GERD is bad Handwriting,  micrographia: No. N/V:  No. Lightheaded:  No.  Syncope: No. Diplopia:  No. Dyskinesia:  No.  Neuroimaging of the brain has not previously been performed.  PREVIOUS MEDICATIONS: none to date  ALLERGIES:  No Known Allergies  CURRENT MEDICATIONS:  Current Outpatient Medications  Medication Instructions   cholecalciferol (VITAMIN D3) 2,000 Units, Oral, Daily   esomeprazole (NEXIUM) 20 mg, Oral, Daily PRN   fluticasone (FLONASE) 50 MCG/ACT nasal spray 1 spray, Each Nare, Daily PRN   levothyroxine (SYNTHROID) 50 mcg, Oral, Daily before breakfast   rosuvastatin (CRESTOR) 5 mg, Oral, Every evening    Objective:   PHYSICAL EXAMINATION:    VITALS:   Vitals:   12/13/22 0835  BP: 126/80  Pulse: 61  SpO2: 98%  Weight: 168 lb 3.2 oz (76.3 kg)  Height: 5\' 9"  (1.753 m)    GEN:  The patient appears stated age and is in NAD. HEENT:  Normocephalic, atraumatic.  The mucous membranes are moist. The superficial temporal arteries are without ropiness or tenderness. CV:  RRR Lungs:  CTAB Neck/HEME:  There are no carotid bruits bilaterally.  Neurological examination:  Orientation: The patient is alert and oriented x3.  Cranial nerves: There is good facial symmetry.  Extraocular muscles are intact. The visual fields are full to confrontational testing. The speech is fluent and clear. Soft palate rises symmetrically and there is no tongue deviation. Hearing is intact to conversational tone.  Sensation: Sensation is intact to light touch throughout (facial, trunk, extremities). Vibration is intact at the bilateral ankle. There is no extinction with double simultaneous stimulation.  Motor: Strength is 5/5 in the bilateral upper and lower extremities.   Shoulder shrug is equal and symmetric.  There is no pronator drift. Deep tendon reflexes: Deep tendon reflexes are 2/4 at the bilateral biceps, triceps, brachioradialis, patella and achilles. Plantar responses are downgoing bilaterally.  Movement  examination: Tone: There is normal tone in the bilateral upper extremities.  The tone in the lower extremities is normal.  Abnormal movements: There is no rest tremor.  There is no postural tremor.  There is no intention tremor.  He has no trouble with Archimedes spirals.  He has no trouble pouring water from 1 glass to another.  He does provide a handwriting sample.  When he writes, the right APB comes tremulous and pulls inward and the forearm pronates somewhat.  The handwriting deteriorates the longer he writes because of this. Coordination:  There is no decremation with RAM's, with any form of RAMS, including alternating supination and pronation of the forearm, hand opening and closing, finger taps, heel taps and toe taps. Gait and Station: The patient has no difficulty arising out of a deep-seated chair without the use of the hands. The patient's stride length is good.  I have reviewed and interpreted the following labs independently   Chemistry      Component Value Date/Time   NA 140 12/13/2019 1401   K 3.6 12/13/2019 1401   CL 108 12/13/2019 1401   CO2 24 12/13/2019 1401   BUN 17 12/13/2019 1401   CREATININE 1.44 (H) 12/13/2019 1401      Component Value Date/Time   CALCIUM 9.0 12/13/2019 1401   ALKPHOS 54 12/13/2019 1401   AST 26 12/13/2019 1401   ALT 20 12/13/2019 1401   BILITOT 1.2 12/13/2019 1401      No results found for: "TSH" Lab Results  Component Value Date   WBC 6.2 06/01/2022   HGB 16.1 06/01/2022   HCT 49.0 06/01/2022   MCV 91.6 06/01/2022   PLT 212 06/01/2022    Patient had lab work by primary care dated October 17, 2022.  Sodium was 140, potassium 4.3, chloride 102, BUN 14, creatinine 0.94, glucose 92, AST 21, ALT 16, TSH 5.68  Total time spent on today's visit was 45 minutes, including both face-to-face time and nonface-to-face time.  Time included that spent on review of records (prior notes available to me/labs/imaging if pertinent), discussing treatment and  goals, answering patient's questions and coordinating care.  Cc:  Daisy Floro, MD

## 2022-12-13 ENCOUNTER — Encounter: Payer: Self-pay | Admitting: Neurology

## 2022-12-13 ENCOUNTER — Ambulatory Visit: Payer: 59 | Admitting: Neurology

## 2022-12-13 VITALS — BP 126/80 | HR 61 | Ht 69.0 in | Wt 168.2 lb

## 2022-12-13 DIAGNOSIS — G252 Other specified forms of tremor: Secondary | ICD-10-CM

## 2022-12-13 NOTE — Patient Instructions (Signed)
It was so good to see you today!  You have dystonic tremor.  We discussed that botox could help, but I would not recommend this right now.  Let us know if you are ready for this.  The physicians and staff at [facility] are committed to providing excellent care. You may receive a survey requesting feedback about your experience at our office. We strive to receive "very good" responses to the survey questions. If you feel that your experience would prevent you from giving the office a "very good " response, please contact our office to try to remedy the situation. We may be reached at [phone]. Thank you for taking the time out of your busy day to complete the survey.

## 2024-03-28 ENCOUNTER — Ambulatory Visit: Attending: Cardiology | Admitting: Cardiology

## 2024-03-28 ENCOUNTER — Encounter: Payer: Self-pay | Admitting: Cardiology

## 2024-03-28 VITALS — BP 179/107 | HR 64 | Ht 69.0 in | Wt 188.0 lb

## 2024-03-28 DIAGNOSIS — E7800 Pure hypercholesterolemia, unspecified: Secondary | ICD-10-CM | POA: Diagnosis not present

## 2024-03-28 DIAGNOSIS — I7789 Other specified disorders of arteries and arterioles: Secondary | ICD-10-CM

## 2024-03-28 NOTE — Patient Instructions (Signed)
 Medication Instructions:  Your physician recommends that you continue on your current medications as directed. Please refer to the Current Medication list given to you today.  *If you need a refill on your cardiac medications before your next appointment, please call your pharmacy*  Lab Work: None ordered  Testing/Procedures: Your physician has requested that you have an echocardiogram. Echocardiography is a painless test that uses sound waves to create images of your heart. It provides your doctor with information about the size and shape of your heart and how well your heart's chambers and valves are working. This procedure takes approximately one hour. There are no restrictions for this procedure. Please do NOT wear cologne, perfume, aftershave, or lotions (deodorant is allowed). Please arrive 15 minutes prior to your appointment time.  Please note: We ask at that you not bring children with you during ultrasound (echo/ vascular) testing. Due to room size and safety concerns, children are not allowed in the ultrasound rooms during exams. Our front office staff cannot provide observation of children in our lobby area while testing is being conducted. An adult accompanying a patient to their appointment will only be allowed in the ultrasound room at the discretion of the ultrasound technician under special circumstances. We apologize for any inconvenience.   Follow-Up: At [facility], you and your health needs are our priority.  As part of our continuing mission to provide you with exceptional heart care, our providers are all part of one team.  This team includes your primary Cardiologist (physician) and Advanced Practice Providers or APPs (Physician Assistants and Nurse Practitioners) who all work together to provide you with the care you need, when you need it.  Your next appointment:   1 year(s)  Provider:   Dr. Jeffrie    Thank you for choosing [facility]!!   [phone]

## 2024-03-28 NOTE — Progress Notes (Signed)
 Cardiology Office Note:  .   Date:  03/28/2024  ID:  Terry Chapman, DOB Mar 29, 1951, MRN [id number] PCP: Okey Carlin Redbird, MD  [facility] Providers Cardiologist:  None    History of Present Illness: .   Terry Chapman is a 73 y.o. male Discussed the use of AI scribe software for clinical note transcription with the patient, who gave verbal consent to proceed.  History of Present Illness Terry Chapman is a 74 year old male who presents with a mildly dilated ascending thoracic aorta. He was referred by Dr. Okey for evaluation of his cardiovascular health.  An ascending thoracic aorta measuring 4.1 cm was identified during a coronary calcium  score test on Nov 23, 2022.  He has a past medical history of hyperlipidemia, managed with Crestor  5 mg daily. His LDL cholesterol was 103 mg/dL in May, slightly elevated compared to previous measurements. He also has a history of hypothyroidism, managed with levothyroxine  50 mcg daily. Additional past medical history includes GERD, childhood asthma, and right total hip replacement.  His family history includes hyperlipidemia in his sisters and a history of stroke in his mother, who passed away at age 55. His father died at age 74. There is no known family history of aortic aneurysms.  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 He has never smoked and maintains an active lifestyle, including regular walking and exercise.  No symptoms related to the mildly dilated aorta and no history of smoking. He is currently working and enjoys his job.    Studies Reviewed: SABRA   EKG Interpretation Date/Time:  Friday March 28 2024 10:21:03 EDT Ventricular Rate:  63 PR Interval:  198 QRS Duration:  80 QT Interval:  412 QTC Calculation: 421 R Axis:   29  Text Interpretation: Normal sinus rhythm Poor R wave progression When compared with ECG of 13-Dec-2019 14:04, No significant change since last tracing  Confirmed by Jeffrie Anes ([id number]) on 03/28/2024 10:45:51 AM    Results LABS LDL cholesterol: 103 (10/2023)  RADIOLOGY Coronary calcium  score: Coronary score 0, ascending thoracic aorta 4.1 cm with aortic atherosclerosis (11/23/2022) Risk Assessment/Calculations:           Physical Exam:   VS:  BP (!) 179/107   Pulse 64   Ht 5' 9 (1.753 m)   Wt 188 lb (85.3 kg)   SpO2 99%   BMI 27.76 kg/m    Wt Readings from Last 3 Encounters:  03/28/24 188 lb (85.3 kg)  12/13/22 168 lb 3.2 oz (76.3 kg)  06/13/22 169 lb (76.7 kg)    GEN: Well nourished, well developed in no acute distress NECK: No JVD; No carotid bruits CARDIAC: RRR, no murmurs, no rubs, no gallops RESPIRATORY:  Clear to auscultation without rales, wheezing or rhonchi  ABDOMEN: Soft, non-tender, non-distended EXTREMITIES:  No edema; No deformity   ASSESSMENT AND PLAN: .    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 This condition is not expected to progress to a size requiring surgical intervention, especially given his age. There is no family history of aortic aneurysms, and he does not have a bicuspid aortic valve, which would alter management. - Order echocardiogram to assess aortic valve and measure aorta - Schedule follow-up imaging (may be ECHO) in one year to monitor aortic size - Advise against heavy lifting or strenuous activities  Aortic atherosclerosis Mild  aortic atherosclerosis with minimal calcification noted on imaging. - Continue Crestor  5 mg daily  Hyperlipidemia LDL cholesterol was 103 mg/dL in May, slightly above the target level. He has been on Crestor  for several years, and the LDL level has been below 100 mg/dL in the past. - Continue Crestor  5 mg daily - Advise dietary modifications to maintain LDL levels          Signed, Oneil Parchment, MD

## 2024-05-07 ENCOUNTER — Ambulatory Visit (HOSPITAL_COMMUNITY)
Admission: RE | Admit: 2024-05-07 | Discharge: 2024-05-07 | Disposition: A | Discharge status: Home / Self Care | Source: Ambulatory Visit | Attending: Internal Medicine | Admitting: Internal Medicine

## 2024-05-07 ENCOUNTER — Ambulatory Visit: Payer: Self-pay | Admitting: Cardiology

## 2024-05-07 DIAGNOSIS — I7789 Other specified disorders of arteries and arterioles: Secondary | ICD-10-CM

## 2024-05-07 DIAGNOSIS — I2510 Atherosclerotic heart disease of native coronary artery without angina pectoris: Secondary | ICD-10-CM

## 2024-05-07 DIAGNOSIS — I77810 Thoracic aortic ectasia: Secondary | ICD-10-CM

## 2024-05-07 DIAGNOSIS — I361 Nonrheumatic tricuspid (valve) insufficiency: Secondary | ICD-10-CM | POA: Diagnosis not present

## 2024-05-07 LAB — ECHOCARDIOGRAM COMPLETE
Area-P 1/2: 3.53 cm2
S' Lateral: 2.88 cm
